# Patient Record
Sex: Male | Born: 1960 | Race: Black or African American | Hispanic: No | Marital: Single | Smoking: Former smoker
Health system: Southern US, Community
[De-identification: ages and names within clinical notes are randomized; demographics above are authoritative.]

## PROBLEM LIST (undated history)

## (undated) DIAGNOSIS — R17 Unspecified jaundice: Secondary | ICD-10-CM

## (undated) DIAGNOSIS — E876 Hypokalemia: Secondary | ICD-10-CM

## (undated) DIAGNOSIS — E43 Unspecified severe protein-calorie malnutrition: Secondary | ICD-10-CM

## (undated) DIAGNOSIS — R7989 Other specified abnormal findings of blood chemistry: Secondary | ICD-10-CM

## (undated) DIAGNOSIS — D649 Anemia, unspecified: Secondary | ICD-10-CM

## (undated) DIAGNOSIS — F1410 Cocaine abuse, uncomplicated: Secondary | ICD-10-CM

## (undated) DIAGNOSIS — K831 Obstruction of bile duct: Principal | ICD-10-CM

## (undated) DIAGNOSIS — R945 Abnormal results of liver function studies: Secondary | ICD-10-CM

## (undated) HISTORY — DX: Unspecified severe protein-calorie malnutrition: E43

## (undated) HISTORY — PX: EYE SURGERY: SHX253

## (undated) HISTORY — DX: Abnormal results of liver function studies: R94.5

## (undated) HISTORY — DX: Hypokalemia: E87.6

## (undated) HISTORY — DX: Unspecified jaundice: R17

## (undated) HISTORY — DX: Obstruction of bile duct: K83.1

## (undated) HISTORY — DX: Other specified abnormal findings of blood chemistry: R79.89

## (undated) HISTORY — DX: Cocaine abuse, uncomplicated: F14.10

## (undated) HISTORY — DX: Anemia, unspecified: D64.9

---

## 2014-09-09 LAB — HEPATIC FUNCTION PANEL
ALK PHOS: 350 U/L — AB (ref 25–125)
ALT: 53 U/L — AB (ref 10–40)
AST: 42 U/L — AB (ref 14–40)
BILIRUBIN, TOTAL: 4.9 mg/dL

## 2014-09-09 LAB — BASIC METABOLIC PANEL
BUN: 53 mg/dL — AB (ref 4–21)
Creatinine: 1.6 mg/dL — AB (ref 0.6–1.3)
Glucose: 88 mg/dL
Potassium: 5.4 mmol/L — AB (ref 3.4–5.3)
SODIUM: 126 mmol/L — AB (ref 137–147)

## 2016-08-15 ENCOUNTER — Encounter (HOSPITAL_COMMUNITY): Payer: Self-pay | Admitting: Emergency Medicine

## 2016-08-15 ENCOUNTER — Emergency Department (HOSPITAL_COMMUNITY): Payer: Medicare Other

## 2016-08-15 ENCOUNTER — Inpatient Hospital Stay (HOSPITAL_COMMUNITY)
Admission: EM | Admit: 2016-08-15 | Discharge: 2016-08-22 | DRG: 445 | Disposition: A | Discharge status: Skilled Nursing Facility | Payer: Medicare Other | Attending: Internal Medicine | Admitting: Internal Medicine

## 2016-08-15 DIAGNOSIS — E871 Hypo-osmolality and hyponatremia: Secondary | ICD-10-CM | POA: Diagnosis present

## 2016-08-15 DIAGNOSIS — E860 Dehydration: Secondary | ICD-10-CM | POA: Diagnosis present

## 2016-08-15 DIAGNOSIS — R7989 Other specified abnormal findings of blood chemistry: Secondary | ICD-10-CM | POA: Diagnosis present

## 2016-08-15 DIAGNOSIS — K831 Obstruction of bile duct: Secondary | ICD-10-CM | POA: Diagnosis present

## 2016-08-15 DIAGNOSIS — E876 Hypokalemia: Secondary | ICD-10-CM | POA: Diagnosis present

## 2016-08-15 DIAGNOSIS — R945 Abnormal results of liver function studies: Secondary | ICD-10-CM

## 2016-08-15 DIAGNOSIS — N179 Acute kidney failure, unspecified: Secondary | ICD-10-CM | POA: Diagnosis present

## 2016-08-15 DIAGNOSIS — R634 Abnormal weight loss: Secondary | ICD-10-CM | POA: Diagnosis present

## 2016-08-15 DIAGNOSIS — R17 Unspecified jaundice: Secondary | ICD-10-CM | POA: Diagnosis not present

## 2016-08-15 DIAGNOSIS — D649 Anemia, unspecified: Secondary | ICD-10-CM | POA: Diagnosis present

## 2016-08-15 DIAGNOSIS — Z87891 Personal history of nicotine dependence: Secondary | ICD-10-CM

## 2016-08-15 LAB — CBC
HCT: 25.9 % — ABNORMAL LOW (ref 39.0–52.0)
HEMOGLOBIN: 8.8 g/dL — AB (ref 13.0–17.0)
MCH: 33.1 pg (ref 26.0–34.0)
MCHC: 34 g/dL (ref 30.0–36.0)
MCV: 97.4 fL (ref 78.0–100.0)
Platelets: 373 10*3/uL (ref 150–400)
RBC: 2.66 MIL/uL — ABNORMAL LOW (ref 4.22–5.81)
RDW: 16.8 % — ABNORMAL HIGH (ref 11.5–15.5)
WBC: 8.3 10*3/uL (ref 4.0–10.5)

## 2016-08-15 LAB — URINALYSIS, ROUTINE W REFLEX MICROSCOPIC
Glucose, UA: NEGATIVE mg/dL
HGB URINE DIPSTICK: NEGATIVE
Ketones, ur: NEGATIVE mg/dL
LEUKOCYTES UA: NEGATIVE
NITRITE: NEGATIVE
PROTEIN: NEGATIVE mg/dL
SPECIFIC GRAVITY, URINE: 1.012 (ref 1.005–1.030)
pH: 5 (ref 5.0–8.0)

## 2016-08-15 LAB — COMPREHENSIVE METABOLIC PANEL
ALK PHOS: 1140 U/L — AB (ref 38–126)
ALT: 330 U/L — ABNORMAL HIGH (ref 17–63)
AST: 407 U/L — ABNORMAL HIGH (ref 15–41)
Albumin: 2.2 g/dL — ABNORMAL LOW (ref 3.5–5.0)
Anion gap: 10 (ref 5–15)
BUN: 24 mg/dL — ABNORMAL HIGH (ref 6–20)
CALCIUM: 8.8 mg/dL — AB (ref 8.9–10.3)
CO2: 21 mmol/L — AB (ref 22–32)
Chloride: 95 mmol/L — ABNORMAL LOW (ref 101–111)
Creatinine, Ser: 1.36 mg/dL — ABNORMAL HIGH (ref 0.61–1.24)
GFR calc Af Amer: >60 mL/min (ref >60)
GFR, EST NON AFRICAN AMERICAN: 57 mL/min — AB (ref >60)
Glucose, Bld: 98 mg/dL (ref 65–99)
Potassium: 3.6 mmol/L (ref 3.5–5.1)
SODIUM: 126 mmol/L — AB (ref 135–145)
Total Bilirubin: 24.9 mg/dL (ref 0.3–1.2)
Total Protein: 6.9 g/dL (ref 6.5–8.1)

## 2016-08-15 LAB — LIPASE, BLOOD: LIPASE: 117 U/L — AB (ref 11–51)

## 2016-08-15 NOTE — ED Provider Notes (Signed)
[facility]-EMERGENCY DEPT Provider Note   CSN: [id number] Arrival date & time: 08/15/16  1634     History   Chief Complaint Chief Complaint  Patient presents with  . Jaundice    HPI Travis Pugh is a 56 y.o. male.  HPI   Patient sent in by urgent care for evaluation of jaundice.  States in early January he had N/V/D x 2 weeks with weight loss and generalized weakness.  Vomiting and diarrhea have resolved, pt is gaining weight back but still feels weak.  He has also had cough and SOB, itching all over for the past few weeks.  He went back to work today and was told his eyes were yellow.  Denies sick contacts, abnormal or concerning foods, recent travel.  Has never lived outside the [place].  No hx liver problems. Does not use tylenol.  Occasional alcohol use.  Denies IVDU.     History reviewed. No pertinent past medical history.  Patient Active Problem List   Diagnosis Date Noted  . Jaundice 08/16/2016    Past Surgical History:  Procedure Laterality Date  . EYE SURGERY     Right       Home Medications    Prior to Admission medications   Medication Sig Start Date End Date Taking? Authorizing Provider  aspirin-sod bicarb-citric acid (ALKA-SELTZER) 325 MG TBEF tablet Take 325 mg by mouth every 6 (six) hours as needed.   Yes Historical Provider, MD  Multiple Vitamins-Minerals (EMERGEN-C VITAMIN C PO) Take 1 packet by mouth daily.   Yes Historical Provider, MD  Phenylephrine-DM (THERAFLU COLD/COUGH DAYTIME PO) Take 1 capsule by mouth daily.   Yes Historical Provider, MD    Family History No family history on file.  Social History Social History  Substance Use Topics  . Smoking status: Former Games developermoker  . Smokeless tobacco: Never Used  . Alcohol use No     Allergies   Patient has no allergy information on record.   Review of Systems Review of Systems   Physical Exam Updated Vital Signs BP 126/83 (BP Location: Left Arm)   Pulse 71   Temp 99.3 F (37.4 C) (Oral)    Resp 16   Ht 5\' 9"  (1.753 m)   Wt 70.3 kg   SpO2 100%   BMI 22.89 kg/m   Physical Exam  Constitutional: He appears well-developed and well-nourished. No distress.  HENT:  Head: Normocephalic and atraumatic.  Eyes: Scleral icterus is present.  Neck: Normal range of motion. Neck supple.  Cardiovascular: Normal rate and regular rhythm.   Pulmonary/Chest: Effort normal and breath sounds normal. No respiratory distress. He has no wheezes. He has no rales.  Abdominal: Soft. He exhibits no distension and no mass. There is no tenderness. There is no rebound and no guarding.  Neurological: He is alert. He exhibits normal muscle tone.  Skin: He is not diaphoretic.  Nursing note and vitals reviewed.    ED Treatments / Results  Labs (all labs ordered are listed, but only abnormal results are displayed) Labs Reviewed  LIPASE, BLOOD - Abnormal; Notable for the following:       Result Value   Lipase 117 (*)    All other components within normal limits  COMPREHENSIVE METABOLIC PANEL - Abnormal; Notable for the following:    Sodium 126 (*)    Chloride 95 (*)    CO2 21 (*)    BUN 24 (*)    Creatinine, Ser 1.36 (*)    Calcium 8.8 (*)  Albumin 2.2 (*)    AST 407 (*)    ALT 330 (*)    Alkaline Phosphatase 1,140 (*)    Total Bilirubin 24.9 (*)    GFR calc non Af Amer 57 (*)    All other components within normal limits  CBC - Abnormal; Notable for the following:    RBC 2.66 (*)    Hemoglobin 8.8 (*)    HCT 25.9 (*)    RDW 16.8 (*)    All other components within normal limits  URINALYSIS, ROUTINE W REFLEX MICROSCOPIC - Abnormal; Notable for the following:    Color, Urine AMBER (*)    Bilirubin Urine MODERATE (*)    All other components within normal limits  PROTIME-INR - Abnormal; Notable for the following:    Prothrombin Time 15.6 (*)    All other components within normal limits  APTT  HEPATITIS PANEL, ACUTE    EKG  EKG Interpretation None       Radiology Dg Chest 2  View  Result Date: 08/15/2016 CLINICAL DATA:  Cough and shortness of breath. Diarrhea. Dry and itchy skin. Symptoms began last month. Patient seen at urgent care and sent here for jaundice. Previous smoker. EXAM: CHEST  2 VIEW COMPARISON:  None. FINDINGS: The heart size and mediastinal contours are within normal limits. Both lungs are clear. The visualized skeletal structures are unremarkable. IMPRESSION: No active cardiopulmonary disease. Electronically Signed   By: Burman Nieves M.D.   On: 08/15/2016 23:03   US Abdomen Complete  Result Date: 08/16/2016 CLINICAL DATA:  Abnormal liver function studies.  Jaundice. EXAM: ABDOMEN ULTRASOUND COMPLETE COMPARISON:  None. FINDINGS: Gallbladder: Gallbladder is mildly distended and is diffusely sludge filled. No focal stones identified. No gallbladder wall thickening or edema. Murphy's sign is positive. Common bile duct: Diameter: 16 mm. Intra and extrahepatic bile ducts are dilated. No stone or obstructing lesion is identified but the distal common bile duct is not visualized due to bowel gas. Liver: Diffusely increased hepatic parenchymal echotexture suggesting fatty infiltration. No focal lesions identified. IVC: No abnormality visualized. Pancreas: Visualized portion unremarkable. Spleen: Size and appearance within normal limits. Right Kidney: Length: 12.7 cm. Echogenicity within normal limits. No mass or hydronephrosis visualized. Left Kidney: Length: 11.5 cm. Echogenicity within normal limits. No mass or hydronephrosis visualized. Abdominal aorta: No aneurysm visualized. Other findings: None. IMPRESSION: Gallbladder is distended and diffusely sludge filled with positive Murphy's sign. No stones are identified but changes could indicate cholecystitis. Intra and extrahepatic bile duct dilatation is present. No cause is identified but the distal common bile duct is not visualized. Electronically Signed   By: Burman Nieves M.D.   On: 08/16/2016 00:40   Ct  Abdomen Pelvis W Contrast  Result Date: 08/16/2016 CLINICAL DATA:  56 y/o M; elevated lipase and liver enzymes. Jaundice. EXAM: CT ABDOMEN AND PELVIS WITH CONTRAST TECHNIQUE: Multidetector CT imaging of the abdomen and pelvis was performed using the standard protocol following bolus administration of intravenous contrast. CONTRAST:  100 cc Isovue-300. COMPARISON:  08/15/2016 abdominal ultrasound. FINDINGS: Lower chest: Mild centrilobular and paraseptal emphysematous changes. Hepatobiliary: Severe intra and extrahepatic biliary ductal dilatation as well as marked enlargement of the gallbladder. Ductal dilatation sharply tapers within the pancreatic head and there is question of a mass near the sphincter of Oddi measuring 15 mm (series 2, image 35). Otherwise there is no focal abnormality of the liver identified. Pancreas: No main duct dilatation of the pancreas or appreciable inflammatory change. Spleen: Normal in size without focal  abnormality. Adrenals/Urinary Tract: Adrenal glands are unremarkable. Kidneys are normal, without renal calculi, focal lesion, or hydronephrosis. Bladder is unremarkable. Stomach/Bowel: Stomach is within normal limits. Appendix appears normal. No evidence of bowel wall thickening, distention, or inflammatory changes. Vascular/Lymphatic: Aortic atherosclerosis with mild calcification, no aneurysm. No enlarged abdominal or pelvic lymph nodes. Reproductive: Central prostatic calcifications. Other: No abdominal wall hernia or abnormality. No abdominopelvic ascites. Musculoskeletal: No acute or significant osseous findings. Transitional L5 vertebral body with articulation of transverse processes to the sacral ala and mild lumbar levocurvature. IMPRESSION: Severe intra and extrahepatic biliary ductal dilatation and gallbladder enlargement with sharp tapering to occlusion of the common bile duct within the pancreatic head. No obstructing radiopaque gallstone identified. Possible mass near the  sphincter of Oddi. No pancreatic ductal dilatation. MRI/MRCP of the abdomen with and without contrast is recommended. Electronically Signed   By: Mitzi Hansen M.D.   On: 08/16/2016 04:48    Procedures Procedures (including critical care time)  Medications Ordered in ED Medications  iopamidol (ISOVUE-300) 61 % injection (not administered)  sodium chloride 0.9 % injection (not administered)  sodium chloride 0.9 % bolus 1,000 mL (0 mLs Intravenous Stopped 08/16/16 0701)  iopamidol (ISOVUE-300) 61 % injection 100 mL (100 mLs Intravenous Contrast Given 08/16/16 0418)     Initial Impression / Assessment and Plan / ED Course  I have reviewed the triage vital signs and the nursing notes.  Pertinent labs & imaging results that were available during my care of the patient were reviewed by me and considered in my medical decision making (see chart for details).  Clinical Course as of Aug 16 726  Wed Aug 16, 2016  4782 Discussed pt with Dr Madilyn Fireman, [facility], who recommends CT abd/pelvis suspecting fixed obstruction somewhere.  Will admit to hospital for further IVF.  CT abd/pelvis pending.    [EW]  0615 Discussed pt with Dr Julian Reil, [name], who accepts patient for admission.   [EW]    Clinical Course User Index [EW] Trixie Dredge, PA-C    Afebrile nontoxic patient with jaundice.  No abdominal pain or tenderness.  Hx severe N/V/D earlier this month that has resolved, had weight decrease and fatigue, now improving.  Labs demonstrate significant elevation LFTs.  CT demonstrates dilatation of biliary system and possible mass.  Findings discussed with patient.  Discussed pt with Dr Madilyn Fireman, GI, who will consult.  Admitted to [name], Dr Julian Reil accepting.    Final Clinical Impressions(s) / ED Diagnoses   Final diagnoses:  Jaundice  Elevated LFTs    New Prescriptions New Prescriptions   No medications on file     [place], PA-C 08/16/16 9562    Mancel Bale,  MD 08/16/16 [phone]

## 2016-08-15 NOTE — ED Triage Notes (Signed)
Patient reports diarrhea & dry/itchy skin starting last month. Patient seen at urgent care and sent here for jaundice.

## 2016-08-16 ENCOUNTER — Inpatient Hospital Stay (HOSPITAL_COMMUNITY): Payer: Medicare Other

## 2016-08-16 ENCOUNTER — Encounter (HOSPITAL_COMMUNITY): Payer: Self-pay | Admitting: Family Medicine

## 2016-08-16 ENCOUNTER — Emergency Department (HOSPITAL_COMMUNITY): Payer: Medicare Other

## 2016-08-16 DIAGNOSIS — E871 Hypo-osmolality and hyponatremia: Secondary | ICD-10-CM | POA: Diagnosis present

## 2016-08-16 DIAGNOSIS — N179 Acute kidney failure, unspecified: Secondary | ICD-10-CM | POA: Diagnosis present

## 2016-08-16 DIAGNOSIS — D649 Anemia, unspecified: Secondary | ICD-10-CM

## 2016-08-16 DIAGNOSIS — K831 Obstruction of bile duct: Secondary | ICD-10-CM

## 2016-08-16 DIAGNOSIS — R634 Abnormal weight loss: Secondary | ICD-10-CM | POA: Diagnosis present

## 2016-08-16 DIAGNOSIS — R17 Unspecified jaundice: Secondary | ICD-10-CM | POA: Diagnosis present

## 2016-08-16 DIAGNOSIS — E860 Dehydration: Secondary | ICD-10-CM | POA: Diagnosis present

## 2016-08-16 DIAGNOSIS — R7989 Other specified abnormal findings of blood chemistry: Secondary | ICD-10-CM | POA: Diagnosis present

## 2016-08-16 DIAGNOSIS — E876 Hypokalemia: Secondary | ICD-10-CM | POA: Diagnosis present

## 2016-08-16 DIAGNOSIS — Z87891 Personal history of nicotine dependence: Secondary | ICD-10-CM | POA: Diagnosis not present

## 2016-08-16 HISTORY — DX: Unspecified jaundice: R17

## 2016-08-16 HISTORY — DX: Obstruction of bile duct: K83.1

## 2016-08-16 HISTORY — DX: Anemia, unspecified: D64.9

## 2016-08-16 LAB — VITAMIN B12: Vitamin B-12: 1018 pg/mL — ABNORMAL HIGH (ref 180–914)

## 2016-08-16 LAB — RETICULOCYTES
RBC.: 2.9 MIL/uL — ABNORMAL LOW (ref 4.22–5.81)
RETIC COUNT ABSOLUTE: 116 10*3/uL (ref 19.0–186.0)
RETIC CT PCT: 4 % — AB (ref 0.4–3.1)

## 2016-08-16 LAB — SODIUM, URINE, RANDOM: SODIUM UR: 39 mmol/L

## 2016-08-16 LAB — FERRITIN: FERRITIN: 1656 ng/mL — AB (ref 24–336)

## 2016-08-16 LAB — RAPID URINE DRUG SCREEN, HOSP PERFORMED
AMPHETAMINES: NOT DETECTED
Barbiturates: NOT DETECTED
Benzodiazepines: NOT DETECTED
Cocaine: POSITIVE — AB
Opiates: NOT DETECTED
Tetrahydrocannabinol: NOT DETECTED

## 2016-08-16 LAB — ACETAMINOPHEN LEVEL

## 2016-08-16 LAB — IRON AND TIBC
IRON: 48 ug/dL (ref 45–182)
Saturation Ratios: ELEVATED % (ref 17.9–39.5)
TIBC: ELEVATED ug/dL (ref 250–450)
UIBC: ELEVATED ug/dL

## 2016-08-16 LAB — CREATININE, URINE, RANDOM: Creatinine, Urine: 88.03 mg/dL

## 2016-08-16 LAB — FOLATE: Folate: 19.1 ng/mL (ref >5.9)

## 2016-08-16 LAB — PROTIME-INR
INR: 1.23
PROTHROMBIN TIME: 15.6 s — AB (ref 11.4–15.2)

## 2016-08-16 LAB — APTT: aPTT: 35 seconds (ref 24–36)

## 2016-08-16 MED ORDER — SODIUM CHLORIDE 0.9 % IV SOLN
INTRAVENOUS | Status: DC
  Administered 2016-08-16 (×2): via INTRAVENOUS
  Administered 2016-08-17: 100 mL/h via INTRAVENOUS
  Administered 2016-08-18: 21:00:00 via INTRAVENOUS
  Administered 2016-08-18: 100 mL/h via INTRAVENOUS
  Administered 2016-08-18 – 2016-08-19 (×2): via INTRAVENOUS
  Administered 2016-08-19: 100 mL/h via INTRAVENOUS
  Administered 2016-08-20 – 2016-08-21 (×6): via INTRAVENOUS

## 2016-08-16 MED ORDER — IOPAMIDOL (ISOVUE-300) INJECTION 61%
INTRAVENOUS | Status: AC
  Filled 2016-08-16: qty 100

## 2016-08-16 MED ORDER — ONDANSETRON HCL 4 MG PO TABS: 4.0000 mg | ORAL_TABLET | Freq: Four times a day (QID) | ORAL | Status: DC | PRN

## 2016-08-16 MED ORDER — SODIUM CHLORIDE 0.9 % IV BOLUS (SEPSIS)
1000.0000 mL | Freq: Once | INTRAVENOUS | Status: AC
  Administered 2016-08-16: 1000 mL via INTRAVENOUS

## 2016-08-16 MED ORDER — SENNOSIDES-DOCUSATE SODIUM 8.6-50 MG PO TABS: 1.0000 | ORAL_TABLET | Freq: Every evening | ORAL | Status: DC | PRN

## 2016-08-16 MED ORDER — ACETAMINOPHEN 650 MG RE SUPP: 650.0000 mg | Freq: Four times a day (QID) | RECTAL | Status: DC | PRN

## 2016-08-16 MED ORDER — HEPARIN SODIUM (PORCINE) 5000 UNIT/ML IJ SOLN
5000.0000 [IU] | Freq: Three times a day (TID) | INTRAMUSCULAR | Status: DC
  Administered 2016-08-16 – 2016-08-17 (×3): 5000 [IU] via SUBCUTANEOUS
  Filled 2016-08-16 (×4): qty 1

## 2016-08-16 MED ORDER — ACETAMINOPHEN 325 MG PO TABS
650.0000 mg | ORAL_TABLET | Freq: Four times a day (QID) | ORAL | Status: DC | PRN
  Administered 2016-08-18 – 2016-08-22 (×3): 650 mg via ORAL
  Filled 2016-08-16 (×3): qty 2

## 2016-08-16 MED ORDER — IOPAMIDOL (ISOVUE-300) INJECTION 61%
100.0000 mL | Freq: Once | INTRAVENOUS | Status: AC | PRN
  Administered 2016-08-16: 100 mL via INTRAVENOUS

## 2016-08-16 MED ORDER — SODIUM CHLORIDE 0.9 % IJ SOLN
INTRAMUSCULAR | Status: AC
  Filled 2016-08-16: qty 50

## 2016-08-16 MED ORDER — GADOBENATE DIMEGLUMINE 529 MG/ML IV SOLN
15.0000 mL | Freq: Once | INTRAVENOUS | Status: AC | PRN
  Administered 2016-08-16: 14 mL via INTRAVENOUS

## 2016-08-16 MED ORDER — ONDANSETRON HCL 4 MG/2ML IJ SOLN
4.0000 mg | Freq: Four times a day (QID) | INTRAMUSCULAR | Status: DC | PRN
  Administered 2016-08-19: 4 mg via INTRAVENOUS
  Filled 2016-08-16: qty 2

## 2016-08-16 NOTE — Progress Notes (Signed)
Patient arrives to [facility] from ED.  CT complete, MRI pending.  Patient has no complaints of pain.  IVF= NS @ 100 ml/hr.  Patient oriented to room, vital signs stable, no distress noted. 

## 2016-08-16 NOTE — ED Notes (Signed)
Pt returned from MRI, MD at the bedside

## 2016-08-16 NOTE — H&P (Addendum)
History and Physical    Quenton Recendez WUJ:[id number] DOB: September 13, 1960  DOA: 08/15/2016 PCP: Knox Royalty, MD Patient coming from: Home   Chief Complaint: yellow eyes   HPI: Travis Pugh is a 56 y.o. male with no significant PMHx, presented to the ED c/o severe jaundice first noted last night. Patient was seen his PCP, due to feeling tired, labs were drawn and patient was told to come to ED.   Patient stated that his sister noted his eyes to be yellow last night and told him to come to the ED. Patient reports itchiness is the only associated symptoms at this time, but recalls having diarrhea, nausea and vomiting about 2 weeks ago that resolved on its own. Patient also report feeling weak for the past 2 weeks. Denies abdominal pain. Denies drug use or casual sex. No recent travel.   ED Course: Elevated LFT's ALP >1,000, AKI cr 1.36. CT Abdomen shows severe intra and extrahepatic ductal duct dilatation and gallbladder enlargement   Review of Systems:   General: No fever chills or decrease in energy. Positive weight loss (~10-20 lb in 1-2 months)  HEENT: no blurry vision, hearing changes or sore throat Respiratory: no dyspnea, coughing, wheezing CV: no chest pain, no palpitations GI: See HPI  GU: no dysuria, burning on urination, increased urinary frequency, hematuria  Ext:. No deformities,  Neuro: no unilateral weakness, numbness, or tingling, no vision change or hearing loss Skin: No rashes, lesions or wounds. MSK: No muscle spasm, no deformity, no limitation of range of movement in spin Heme: No easy bruising.  Travel history: No recent long distant travel.   History reviewed. No pertinent past medical history.  Past Surgical History:  Procedure Laterality Date  . EYE SURGERY     Right     reports that he has quit smoking. He has never used smokeless tobacco. He reports that he does not drink alcohol or use drugs.   History reviewed. No pertinent family history.   Prior  to Admission medications   Medication Sig Start Date End Date Taking? Authorizing Provider  aspirin-sod bicarb-citric acid (ALKA-SELTZER) 325 MG TBEF tablet Take 325 mg by mouth every 6 (six) hours as needed.   Yes Historical Provider, MD  Multiple Vitamins-Minerals (EMERGEN-C VITAMIN C PO) Take 1 packet by mouth daily.   Yes Historical Provider, MD  Phenylephrine-DM (THERAFLU COLD/COUGH DAYTIME PO) Take 1 capsule by mouth daily.   Yes Historical Provider, MD    Physical Exam: Vitals:   08/16/16 0145 08/16/16 0147 08/16/16 0431 08/16/16 0652  BP: 128/90 128/90 117/83 126/83  Pulse: 80 82 78 71  Resp:  16 16 16   Temp:   99.3 F (37.4 C)   TempSrc:   Oral   SpO2: 100% 100% 100% 100%  Weight:      Height:         Constitutional: NAD, calm, comfortable Eyes: PERRL, scleral icterus 3+  ENMT: Mucous membranes are moist. Posterior pharynx clear of any exudate or lesions.  Neck: normal, supple, no masses, no thyromegaly Respiratory: clear to auscultation bilaterally, no wheezing, no crackles. Normal respiratory effort. No accessory muscle use.  Cardiovascular: Regular rate and rhythm, no murmurs / rubs / gallops. No extremity edema. 2+ pedal pulses.  Abdomen: no tenderness, no masses palpated. No hepatosplenomegaly. Bowel sounds positive.  Musculoskeletal: no clubbing. Good ROM, no contractures. Normal muscle tone.  Skin: dry, jaundice. No rash or ulcers Neurologic: CN 2-12 grossly intact. Sensation intact. Strength 5/5 in all 4.  Psychiatric:  Normal judgment and insight. Alert and oriented x 3. Normal mood.    Labs on Admission: I have personally reviewed following labs and imaging studies  CBC:  Recent Labs Lab 08/15/16 2222  WBC 8.3  HGB 8.8*  HCT 25.9*  MCV 97.4  PLT 373   Basic Metabolic Panel:  Recent Labs Lab 08/15/16 2222  NA 126*  K 3.6  CL 95*  CO2 21*  GLUCOSE 98  BUN 24*  CREATININE 1.36*  CALCIUM 8.8*   GFR: Estimated Creatinine Clearance: 61 mL/min  (by C-G formula based on SCr of 1.36 mg/dL (H)). Liver Function Tests:  Recent Labs Lab 08/15/16 2222  AST 407*  ALT 330*  ALKPHOS 1,140*  BILITOT 24.9*  PROT 6.9  ALBUMIN 2.2*    Recent Labs Lab 08/15/16 2222  LIPASE 117*    Coagulation Profile:  Recent Labs Lab 08/15/16 2222  INR 1.23   Cardiac Enzymes: No results for input(s): CKTOTAL, CKMB, CKMBINDEX, TROPONINI in the last 168 hours. BNP (last 3 results) No results for input(s): PROBNP in the last 8760 hours. HbA1C: No results for input(s): HGBA1C in the last 72 hours. CBG: No results for input(s): GLUCAP in the last 168 hours. Lipid Profile: No results for input(s): CHOL, HDL, LDLCALC, TRIG, CHOLHDL, LDLDIRECT in the last 72 hours. Thyroid Function Tests: No results for input(s): TSH, T4TOTAL, FREET4, T3FREE, THYROIDAB in the last 72 hours. Anemia Panel: No results for input(s): VITAMINB12, FOLATE, FERRITIN, TIBC, IRON, RETICCTPCT in the last 72 hours. Urine analysis:    Component Value Date/Time   COLORURINE AMBER (A) 08/15/2016 2024   APPEARANCEUR CLEAR 08/15/2016 2024   LABSPEC 1.012 08/15/2016 2024   PHURINE 5.0 08/15/2016 2024   GLUCOSEU NEGATIVE 08/15/2016 2024   HGBUR NEGATIVE 08/15/2016 2024   BILIRUBINUR MODERATE (A) 08/15/2016 2024   KETONESUR NEGATIVE 08/15/2016 2024   PROTEINUR NEGATIVE 08/15/2016 2024   NITRITE NEGATIVE 08/15/2016 2024   LEUKOCYTESUR NEGATIVE 08/15/2016 2024    Radiological Exams on Admission: Dg Chest 2 View  Result Date: 08/15/2016 CLINICAL DATA:  Cough and shortness of breath. Diarrhea. Dry and itchy skin. Symptoms began last month. Patient seen at urgent care and sent here for jaundice. Previous smoker. EXAM: CHEST  2 VIEW COMPARISON:  None. FINDINGS: The heart size and mediastinal contours are within normal limits. Both lungs are clear. The visualized skeletal structures are unremarkable. IMPRESSION: No active cardiopulmonary disease. Electronically Signed   By:  Burman NievesWilliam  Stevens M.D.   On: 08/15/2016 23:03   [place] Abdomen Complete  Result Date: 08/16/2016 CLINICAL DATA:  Abnormal liver function studies.  Jaundice. EXAM: ABDOMEN ULTRASOUND COMPLETE COMPARISON:  None. FINDINGS: Gallbladder: Gallbladder is mildly distended and is diffusely sludge filled. No focal stones identified. No gallbladder wall thickening or edema. Murphy's sign is positive. Common bile duct: Diameter: 16 mm. Intra and extrahepatic bile ducts are dilated. No stone or obstructing lesion is identified but the distal common bile duct is not visualized due to bowel gas. Liver: Diffusely increased hepatic parenchymal echotexture suggesting fatty infiltration. No focal lesions identified. IVC: No abnormality visualized. Pancreas: Visualized portion unremarkable. Spleen: Size and appearance within normal limits. Right Kidney: Length: 12.7 cm. Echogenicity within normal limits. No mass or hydronephrosis visualized. Left Kidney: Length: 11.5 cm. Echogenicity within normal limits. No mass or hydronephrosis visualized. Abdominal aorta: No aneurysm visualized. Other findings: None. IMPRESSION: Gallbladder is distended and diffusely sludge filled with positive Murphy's sign. No stones are identified but changes could indicate cholecystitis. Intra and extrahepatic bile  duct dilatation is present. No cause is identified but the distal common bile duct is not visualized. Electronically Signed   By: Burman Nieves M.D.   On: 08/16/2016 00:40   Ct Abdomen Pelvis W Contrast  Result Date: 08/16/2016 CLINICAL DATA:  56 y/o M; elevated lipase and liver enzymes. Jaundice. EXAM: CT ABDOMEN AND PELVIS WITH CONTRAST TECHNIQUE: Multidetector CT imaging of the abdomen and pelvis was performed using the standard protocol following bolus administration of intravenous contrast. CONTRAST:  100 cc Isovue-300. COMPARISON:  08/15/2016 abdominal ultrasound. FINDINGS: Lower chest: Mild centrilobular and paraseptal emphysematous  changes. Hepatobiliary: Severe intra and extrahepatic biliary ductal dilatation as well as marked enlargement of the gallbladder. Ductal dilatation sharply tapers within the pancreatic head and there is question of a mass near the sphincter of Oddi measuring 15 mm (series 2, image 35). Otherwise there is no focal abnormality of the liver identified. Pancreas: No main duct dilatation of the pancreas or appreciable inflammatory change. Spleen: Normal in size without focal abnormality. Adrenals/Urinary Tract: Adrenal glands are unremarkable. Kidneys are normal, without renal calculi, focal lesion, or hydronephrosis. Bladder is unremarkable. Stomach/Bowel: Stomach is within normal limits. Appendix appears normal. No evidence of bowel wall thickening, distention, or inflammatory changes. Vascular/Lymphatic: Aortic atherosclerosis with mild calcification, no aneurysm. No enlarged abdominal or pelvic lymph nodes. Reproductive: Central prostatic calcifications. Other: No abdominal wall hernia or abnormality. No abdominopelvic ascites. Musculoskeletal: No acute or significant osseous findings. Transitional L5 vertebral body with articulation of transverse processes to the sacral ala and mild lumbar levocurvature. IMPRESSION: Severe intra and extrahepatic biliary ductal dilatation and gallbladder enlargement with sharp tapering to occlusion of the common bile duct within the pancreatic head. No obstructing radiopaque gallstone identified. Possible mass near the sphincter of Oddi. No pancreatic ductal dilatation. MRI/MRCP of the abdomen with and without contrast is recommended. Electronically Signed   By: Mitzi Hansen M.D.   On: 08/16/2016 04:48    EKG: Ordered   Assessment/Plan  Common bile duct obstruction/Painless jaundice - CT show a possible mass causing obstruction, AST 407, ALT 330, ALP 1140. T bili 24.9 Admit to medsurg GI consult  MRCP  Hepatitis panel and HIV  NPO  IVF  Trend LFT's  AKI -  due to dehydration, unknown Cr baseline  IVF  Check Cr in AM Get urine lytes  Avoid nephrotoxin  Hyponatremia - likely from dehydration - asymptomatic  IVF - NS  Check BMP in AM   Anemia - unknown etiology at this time ? Hemolysis, no signs of overt bleeding  Anemia panel  Transfuse if Hgb < 7  Monitor for now   DVT prophylaxis: Heparin sq Code Status: FULL Family Communication: None at bedside  Disposition Plan: Anticipate discharge to previous home environment.  Consults called: EDP discussed case with Dr Madilyn Fireman (GI) Admission status: Inpatient - Medsurg   Latrelle Dodrill MD [name] Pager [phone]  If 7PM-7AM, please contact night-coverage [email or web address] Password [facility]  08/16/2016, 9:24 AM

## 2016-08-16 NOTE — Consult Note (Signed)
[facility] Consult Note  Referring Provider: No ref. provider found Primary Care Physician:  Andria Frames, MD Primary Gastroenterologist:  Dr.  Laurel Dimmer Complaint: Jaundice HPI: Travis Pugh is an 56 y.o. black male  who presents noticing worsening scleral icterus and pruritus after initially noticing nausea vomiting diarrhea about 2 weeks ago which improved. However he has recovered from those symptoms but has had developing jaundice and states he has had about a 20 pound weight loss despite a fairly good appetite. He has not had any abdominal pain. He went to his primary care physician was noted to be jaundiced and referred here. He has a bilirubin of 25 and alkaline phosphatase is greater than 1000.  History reviewed. No pertinent past medical history.  Past Surgical History:  Procedure Laterality Date  . EYE SURGERY     Right     (Not in a hospital admission)  Allergies: Not on File  History reviewed. No pertinent family history.  Social History:  reports that he has quit smoking. He has never used smokeless tobacco. He reports that he does not drink alcohol or use drugs.  Review of Systems: negative except as above   Blood pressure 122/86, pulse 75, temperature 98.8 F (37.1 C), temperature source Oral, resp. rate 18, height 5' 9"  (1.753 m), weight 70.3 kg (155 lb), SpO2 100 %. Head: Normocephalic, without obvious abnormality, atraumatic Neck: no adenopathy, no carotid bruit, no JVD, supple, symmetrical, trachea midline and thyroid not enlarged, symmetric, no tenderness/mass/nodules Resp: clear to auscultation bilaterally Cardio: regular rate and rhythm, S1, S2 normal, no murmur, click, rub or gallop GI: abdomen soft no mass Extremities: extremities normal, atraumatic, no cyanosis or edema  Results for orders placed or performed during the hospital encounter of 08/15/16 (from the past 48 hour(s))  Urinalysis, Routine w reflex microscopic     Status: Abnormal    Collection Time: 08/15/16  8:24 PM  Result Value Ref Range   Color, Urine AMBER (A) YELLOW    Comment: BIOCHEMICALS MAY BE AFFECTED BY COLOR   APPearance CLEAR CLEAR   Specific Gravity, Urine 1.012 1.005 - 1.030   pH 5.0 5.0 - 8.0   Glucose, UA NEGATIVE NEGATIVE mg/dL   Hgb urine dipstick NEGATIVE NEGATIVE   Bilirubin Urine MODERATE (A) NEGATIVE   Ketones, ur NEGATIVE NEGATIVE mg/dL   Protein, ur NEGATIVE NEGATIVE mg/dL   Nitrite NEGATIVE NEGATIVE   Leukocytes, UA NEGATIVE NEGATIVE  Lipase, blood     Status: Abnormal   Collection Time: 08/15/16 10:22 PM  Result Value Ref Range   Lipase 117 (H) 11 - 51 U/L  Comprehensive metabolic panel     Status: Abnormal   Collection Time: 08/15/16 10:22 PM  Result Value Ref Range   Sodium 126 (L) 135 - 145 mmol/L   Potassium 3.6 3.5 - 5.1 mmol/L   Chloride 95 (L) 101 - 111 mmol/L   CO2 21 (L) 22 - 32 mmol/L   Glucose, Bld 98 65 - 99 mg/dL   BUN 24 (H) 6 - 20 mg/dL   Creatinine, Ser 1.36 (H) 0.61 - 1.24 mg/dL   Calcium 8.8 (L) 8.9 - 10.3 mg/dL   Total Protein 6.9 6.5 - 8.1 g/dL   Albumin 2.2 (L) 3.5 - 5.0 g/dL   AST 407 (H) 15 - 41 U/L    Comment: RESULTS CONFIRMED BY MANUAL DILUTION   ALT 330 (H) 17 - 63 U/L   Alkaline Phosphatase 1,140 (H) 38 - 126 U/L   Total Bilirubin 24.9 (  HH) 0.3 - 1.2 mg/dL    Comment: CRITICAL RESULT CALLED TO, READ BACK BY AND VERIFIED WITH: Jordan Hawks RN [id number] 08/15/16 A NAVARRO    GFR calc non Af Amer 57 (L) >60 mL/min   GFR calc Af Amer >60 >60 mL/min    Comment: (NOTE) The eGFR has been calculated using the CKD EPI equation. This calculation has not been validated in all clinical situations. eGFR's persistently <60 mL/min signify possible Chronic Kidney Disease.    Anion gap 10 5 - 15  CBC     Status: Abnormal   Collection Time: 08/15/16 10:22 PM  Result Value Ref Range   WBC 8.3 4.0 - 10.5 K/uL   RBC 2.66 (L) 4.22 - 5.81 MIL/uL   Hemoglobin 8.8 (L) 13.0 - 17.0 g/dL   HCT 25.9 (L) 39.0 - 52.0 %    MCV 97.4 78.0 - 100.0 fL   MCH 33.1 26.0 - 34.0 pg   MCHC 34.0 30.0 - 36.0 g/dL   RDW 16.8 (H) 11.5 - 15.5 %   Platelets 373 150 - 400 K/uL  Protime-INR     Status: Abnormal   Collection Time: 08/15/16 10:22 PM  Result Value Ref Range   Prothrombin Time 15.6 (H) 11.4 - 15.2 seconds   INR 1.23   APTT     Status: None   Collection Time: 08/15/16 10:22 PM  Result Value Ref Range   aPTT 35 24 - 36 seconds   Dg Chest 2 View  Result Date: 08/15/2016 CLINICAL DATA:  Cough and shortness of breath. Diarrhea. Dry and itchy skin. Symptoms began last month. Patient seen at urgent care and sent here for jaundice. Previous smoker. EXAM: CHEST  2 VIEW COMPARISON:  None. FINDINGS: The heart size and mediastinal contours are within normal limits. Both lungs are clear. The visualized skeletal structures are unremarkable. IMPRESSION: No active cardiopulmonary disease. Electronically Signed   By: Lucienne Capers M.D.   On: 08/15/2016 23:03   US Abdomen Complete  Result Date: 08/16/2016 CLINICAL DATA:  Abnormal liver function studies.  Jaundice. EXAM: ABDOMEN ULTRASOUND COMPLETE COMPARISON:  None. FINDINGS: Gallbladder: Gallbladder is mildly distended and is diffusely sludge filled. No focal stones identified. No gallbladder wall thickening or edema. Murphy's sign is positive. Common bile duct: Diameter: 16 mm. Intra and extrahepatic bile ducts are dilated. No stone or obstructing lesion is identified but the distal common bile duct is not visualized due to bowel gas. Liver: Diffusely increased hepatic parenchymal echotexture suggesting fatty infiltration. No focal lesions identified. IVC: No abnormality visualized. Pancreas: Visualized portion unremarkable. Spleen: Size and appearance within normal limits. Right Kidney: Length: 12.7 cm. Echogenicity within normal limits. No mass or hydronephrosis visualized. Left Kidney: Length: 11.5 cm. Echogenicity within normal limits. No mass or hydronephrosis visualized.  Abdominal aorta: No aneurysm visualized. Other findings: None. IMPRESSION: Gallbladder is distended and diffusely sludge filled with positive Murphy's sign. No stones are identified but changes could indicate cholecystitis. Intra and extrahepatic bile duct dilatation is present. No cause is identified but the distal common bile duct is not visualized. Electronically Signed   By: Lucienne Capers M.D.   On: 08/16/2016 00:40   Ct Abdomen Pelvis W Contrast  Result Date: 08/16/2016 CLINICAL DATA:  56 y/o M; elevated lipase and liver enzymes. Jaundice. EXAM: CT ABDOMEN AND PELVIS WITH CONTRAST TECHNIQUE: Multidetector CT imaging of the abdomen and pelvis was performed using the standard protocol following bolus administration of intravenous contrast. CONTRAST:  100 cc Isovue-300. COMPARISON:  08/15/2016  abdominal ultrasound. FINDINGS: Lower chest: Mild centrilobular and paraseptal emphysematous changes. Hepatobiliary: Severe intra and extrahepatic biliary ductal dilatation as well as marked enlargement of the gallbladder. Ductal dilatation sharply tapers within the pancreatic head and there is question of a mass near the sphincter of Oddi measuring 15 mm (series 2, image 35). Otherwise there is no focal abnormality of the liver identified. Pancreas: No main duct dilatation of the pancreas or appreciable inflammatory change. Spleen: Normal in size without focal abnormality. Adrenals/Urinary Tract: Adrenal glands are unremarkable. Kidneys are normal, without renal calculi, focal lesion, or hydronephrosis. Bladder is unremarkable. Stomach/Bowel: Stomach is within normal limits. Appendix appears normal. No evidence of bowel wall thickening, distention, or inflammatory changes. Vascular/Lymphatic: Aortic atherosclerosis with mild calcification, no aneurysm. No enlarged abdominal or pelvic lymph nodes. Reproductive: Central prostatic calcifications. Other: No abdominal wall hernia or abnormality. No abdominopelvic ascites.  Musculoskeletal: No acute or significant osseous findings. Transitional L5 vertebral body with articulation of transverse processes to the sacral ala and mild lumbar levocurvature. IMPRESSION: Severe intra and extrahepatic biliary ductal dilatation and gallbladder enlargement with sharp tapering to occlusion of the common bile duct within the pancreatic head. No obstructing radiopaque gallstone identified. Possible mass near the sphincter of Oddi. No pancreatic ductal dilatation. MRI/MRCP of the abdomen with and without contrast is recommended. Electronically Signed   By: Kristine Garbe M.D.   On: 08/16/2016 04:48    Assessment: High-grade biliary obstruction presumed was from a very distal common bile duct or periampullary source. Plan:  Await MRCP. Proceed with ERCP tomorrow. Risks rationale and alternatives were explained to the patient and he wished to proceed. Irmgard Rampersaud C 08/16/2016, 10:51 AM  Pager [phone] If no answer or after 5 PM call [phone]

## 2016-08-16 NOTE — Progress Notes (Signed)
Pt currently in MRI. Will return and finish consult

## 2016-08-16 NOTE — ED Notes (Signed)
Patient transported to MRI 

## 2016-08-16 NOTE — ED Notes (Signed)
Bed: WA33 Expected date:  Expected time:  Means of arrival:  Comments: 

## 2016-08-16 NOTE — ED Notes (Signed)
He is sleeping when I enter his room. He arouses easily and is oriented x 4. His only request is to have me place a urinal at bedside; which I do.

## 2016-08-17 ENCOUNTER — Encounter (HOSPITAL_COMMUNITY)
Admission: EM | Disposition: A | Discharge status: Skilled Nursing Facility | Payer: Self-pay | Source: Home / Self Care | Attending: Internal Medicine

## 2016-08-17 ENCOUNTER — Encounter (HOSPITAL_COMMUNITY): Payer: Self-pay | Admitting: *Deleted

## 2016-08-17 ENCOUNTER — Inpatient Hospital Stay (HOSPITAL_COMMUNITY): Payer: Medicare Other | Admitting: Certified Registered Nurse Anesthetist

## 2016-08-17 ENCOUNTER — Inpatient Hospital Stay (HOSPITAL_COMMUNITY): Payer: Medicare Other

## 2016-08-17 DIAGNOSIS — N179 Acute kidney failure, unspecified: Secondary | ICD-10-CM

## 2016-08-17 DIAGNOSIS — K831 Obstruction of bile duct: Principal | ICD-10-CM

## 2016-08-17 DIAGNOSIS — D649 Anemia, unspecified: Secondary | ICD-10-CM

## 2016-08-17 DIAGNOSIS — E871 Hypo-osmolality and hyponatremia: Secondary | ICD-10-CM

## 2016-08-17 DIAGNOSIS — R17 Unspecified jaundice: Secondary | ICD-10-CM

## 2016-08-17 DIAGNOSIS — E860 Dehydration: Secondary | ICD-10-CM

## 2016-08-17 HISTORY — PX: ERCP: SHX5425

## 2016-08-17 LAB — COMPREHENSIVE METABOLIC PANEL
ALK PHOS: 1048 U/L — AB (ref 38–126)
ALT: 258 U/L — ABNORMAL HIGH (ref 17–63)
ANION GAP: 7 (ref 5–15)
AST: 243 U/L — ABNORMAL HIGH (ref 15–41)
Albumin: 1.8 g/dL — ABNORMAL LOW (ref 3.5–5.0)
BILIRUBIN TOTAL: 19 mg/dL — AB (ref 0.3–1.2)
BUN: 15 mg/dL (ref 6–20)
CALCIUM: 8.4 mg/dL — AB (ref 8.9–10.3)
CO2: 21 mmol/L — ABNORMAL LOW (ref 22–32)
Chloride: 101 mmol/L (ref 101–111)
Creatinine, Ser: 0.95 mg/dL (ref 0.61–1.24)
GFR calc Af Amer: >60 mL/min (ref >60)
Glucose, Bld: 99 mg/dL (ref 65–99)
POTASSIUM: 3.8 mmol/L (ref 3.5–5.1)
Sodium: 129 mmol/L — ABNORMAL LOW (ref 135–145)
TOTAL PROTEIN: 5.9 g/dL — AB (ref 6.5–8.1)

## 2016-08-17 LAB — CBC WITH DIFFERENTIAL/PLATELET
BASOS ABS: 0 10*3/uL (ref 0.0–0.1)
BASOS PCT: 0 %
EOS PCT: 4 %
Eosinophils Absolute: 0.3 10*3/uL (ref 0.0–0.7)
HCT: 25.1 % — ABNORMAL LOW (ref 39.0–52.0)
Hemoglobin: 8.3 g/dL — ABNORMAL LOW (ref 13.0–17.0)
LYMPHS PCT: 12 %
Lymphs Abs: 0.7 10*3/uL (ref 0.7–4.0)
MCH: 32.3 pg (ref 26.0–34.0)
MCHC: 33.1 g/dL (ref 30.0–36.0)
MCV: 97.7 fL (ref 78.0–100.0)
MONO ABS: 0.6 10*3/uL (ref 0.1–1.0)
Monocytes Relative: 9 %
NEUTROS ABS: 4.5 10*3/uL (ref 1.7–7.7)
Neutrophils Relative %: 75 %
PLATELETS: 381 10*3/uL (ref 150–400)
RBC: 2.57 MIL/uL — AB (ref 4.22–5.81)
RDW: 17.1 % — AB (ref 11.5–15.5)
WBC: 6.1 10*3/uL (ref 4.0–10.5)

## 2016-08-17 LAB — HEPATITIS PANEL, ACUTE
HCV Ab: <0.1 s/co ratio (ref 0.0–0.9)
HEP A IGM: NEGATIVE
HEP B C IGM: NEGATIVE
HEP B S AG: NEGATIVE

## 2016-08-17 LAB — HIV ANTIBODY (ROUTINE TESTING W REFLEX): HIV Screen 4th Generation wRfx: NONREACTIVE

## 2016-08-17 SURGERY — ERCP, WITH INTERVENTION IF INDICATED
Anesthesia: General

## 2016-08-17 MED ORDER — LIDOCAINE 2% (20 MG/ML) 5 ML SYRINGE
INTRAMUSCULAR | Status: DC | PRN
  Administered 2016-08-17: 100 mg via INTRAVENOUS

## 2016-08-17 MED ORDER — ONDANSETRON HCL 4 MG/2ML IJ SOLN
INTRAMUSCULAR | Status: DC | PRN
  Administered 2016-08-17: 4 mg via INTRAVENOUS

## 2016-08-17 MED ORDER — EPHEDRINE SULFATE-NACL 50-0.9 MG/10ML-% IV SOSY
PREFILLED_SYRINGE | INTRAVENOUS | Status: DC | PRN
  Administered 2016-08-17: 5 mg via INTRAVENOUS

## 2016-08-17 MED ORDER — PROMETHAZINE HCL 25 MG/ML IJ SOLN
6.2500 mg | INTRAMUSCULAR | Status: DC | PRN
Start: 2016-08-17 — End: 2016-08-17

## 2016-08-17 MED ORDER — FENTANYL CITRATE (PF) 100 MCG/2ML IJ SOLN: 25.0000 ug | INTRAMUSCULAR | Status: DC | PRN

## 2016-08-17 MED ORDER — DEXAMETHASONE SODIUM PHOSPHATE 4 MG/ML IJ SOLN
INTRAMUSCULAR | Status: DC | PRN
  Administered 2016-08-17: 10 mg via INTRAVENOUS

## 2016-08-17 MED ORDER — PHENYLEPHRINE 40 MCG/ML (10ML) SYRINGE FOR IV PUSH (FOR BLOOD PRESSURE SUPPORT)
PREFILLED_SYRINGE | INTRAVENOUS | Status: DC | PRN
  Administered 2016-08-17: 40 ug via INTRAVENOUS
  Administered 2016-08-17 (×6): 80 ug via INTRAVENOUS

## 2016-08-17 MED ORDER — PHENYLEPHRINE 40 MCG/ML (10ML) SYRINGE FOR IV PUSH (FOR BLOOD PRESSURE SUPPORT)
PREFILLED_SYRINGE | INTRAVENOUS | Status: AC
  Filled 2016-08-17: qty 10

## 2016-08-17 MED ORDER — ONDANSETRON HCL 4 MG/2ML IJ SOLN
INTRAMUSCULAR | Status: AC
  Filled 2016-08-17: qty 2

## 2016-08-17 MED ORDER — LACTATED RINGERS IV SOLN
INTRAVENOUS | Status: DC
Start: 2016-08-17 — End: 2016-08-22

## 2016-08-17 MED ORDER — SODIUM CHLORIDE 0.9 % IV SOLN: INTRAVENOUS | Status: DC

## 2016-08-17 MED ORDER — ROCURONIUM BROMIDE 50 MG/5ML IV SOSY
PREFILLED_SYRINGE | INTRAVENOUS | Status: DC | PRN
  Administered 2016-08-17: 50 mg via INTRAVENOUS

## 2016-08-17 MED ORDER — LIDOCAINE 2% (20 MG/ML) 5 ML SYRINGE
INTRAMUSCULAR | Status: AC
  Filled 2016-08-17: qty 5

## 2016-08-17 MED ORDER — FENTANYL CITRATE (PF) 100 MCG/2ML IJ SOLN
INTRAMUSCULAR | Status: DC | PRN
  Administered 2016-08-17: 100 ug via INTRAVENOUS

## 2016-08-17 MED ORDER — GLUCAGON HCL RDNA (DIAGNOSTIC) 1 MG IJ SOLR
INTRAMUSCULAR | Status: AC
  Filled 2016-08-17: qty 1

## 2016-08-17 MED ORDER — SODIUM CHLORIDE 0.9 % IV SOLN
1.5000 g | Freq: Once | INTRAVENOUS | Status: AC
  Administered 2016-08-17: 1.5 g via INTRAVENOUS
  Filled 2016-08-17: qty 1.5

## 2016-08-17 MED ORDER — SUGAMMADEX SODIUM 200 MG/2ML IV SOLN
INTRAVENOUS | Status: DC | PRN
  Administered 2016-08-17: 150 mg via INTRAVENOUS

## 2016-08-17 MED ORDER — SUGAMMADEX SODIUM 500 MG/5ML IV SOLN
INTRAVENOUS | Status: AC
  Filled 2016-08-17: qty 5

## 2016-08-17 MED ORDER — ROCURONIUM BROMIDE 50 MG/5ML IV SOSY
PREFILLED_SYRINGE | INTRAVENOUS | Status: AC
  Filled 2016-08-17: qty 5

## 2016-08-17 MED ORDER — DEXAMETHASONE SODIUM PHOSPHATE 10 MG/ML IJ SOLN
INTRAMUSCULAR | Status: AC
  Filled 2016-08-17: qty 1

## 2016-08-17 MED ORDER — SODIUM CHLORIDE 0.9 % IV SOLN
INTRAVENOUS | Status: DC | PRN
  Administered 2016-08-17: 20 mL

## 2016-08-17 MED ORDER — PROPOFOL 10 MG/ML IV BOLUS
INTRAVENOUS | Status: AC
  Filled 2016-08-17: qty 20

## 2016-08-17 MED ORDER — HEPARIN SODIUM (PORCINE) 5000 UNIT/ML IJ SOLN
5000.0000 [IU] | Freq: Three times a day (TID) | INTRAMUSCULAR | Status: DC
  Administered 2016-08-18 – 2016-08-22 (×12): 5000 [IU] via SUBCUTANEOUS
  Filled 2016-08-17 (×11): qty 1

## 2016-08-17 MED ORDER — FENTANYL CITRATE (PF) 100 MCG/2ML IJ SOLN
INTRAMUSCULAR | Status: AC
  Filled 2016-08-17: qty 2

## 2016-08-17 MED ORDER — PIPERACILLIN-TAZOBACTAM 3.375 G IVPB
3.3750 g | INTRAVENOUS | Status: AC
  Administered 2016-08-18: 3.375 g via INTRAVENOUS
  Filled 2016-08-17 (×2): qty 50

## 2016-08-17 MED ORDER — PROPOFOL 10 MG/ML IV BOLUS
INTRAVENOUS | Status: DC | PRN
  Administered 2016-08-17: 150 mg via INTRAVENOUS

## 2016-08-17 NOTE — Anesthesia Procedure Notes (Signed)
Procedure Name: Intubation Date/Time: 08/17/2016 11:23 AM Performed by: Ludwig LeanJONES, Valdemar Mcclenahan C Pre-anesthesia Checklist: Patient identified, Emergency Drugs available, Suction available and Patient being monitored Patient Re-evaluated:Patient Re-evaluated prior to inductionOxygen Delivery Method: Circle system utilized Preoxygenation: Pre-oxygenation with 100% oxygen Intubation Type: IV induction Ventilation: Mask ventilation without difficulty Tube type: Oral Tube size: 7.5 mm Number of attempts: 1 Airway Equipment and Method: Stylet and Oral airway Placement Confirmation: ETT inserted through vocal cords under direct vision,  positive ETCO2 and breath sounds checked- equal and bilateral Secured at: 23 cm Tube secured with: Tape Dental Injury: Teeth and Oropharynx as per pre-operative assessment

## 2016-08-17 NOTE — H&P (View-Only) (Signed)
Patient arrives to [facility] from ED.  CT complete, MRI pending.  Patient has no complaints of pain.  IVF= NS @ 100 ml/hr.  Patient oriented to room, vital signs stable, no distress noted.

## 2016-08-17 NOTE — Interval H&P Note (Signed)
History and Physical Interval Note:  08/17/2016 11:12 AM  Travis Pugh  has presented today for surgery, with the diagnosis of biliary obstruction  The various methods of treatment have been discussed with the patient and family. After consideration of risks, benefits and other options for treatment, the patient has consented to  Procedure(s): ENDOSCOPIC RETROGRADE CHOLANGIOPANCREATOGRAPHY (ERCP) (N/A) as a surgical intervention .  The patient's history has been reviewed, patient examined, no change in status, stable for surgery.  I have reviewed the patient's chart and labs.  Questions were answered to the patient's satisfaction.     Ellarose Brandi C

## 2016-08-17 NOTE — Progress Notes (Signed)
[name] PROGRESS NOTE  Travis Pugh OZH:[id number] DOB: [birth year] DOA: 08/15/2016 PCP: [name], MD  Interim summary and HPI 56 y.o. male with no significant PMHx, presented to the ED c/o severe jaundice first noted last night. Patient was seen his PCP, due to feeling tired, labs were drawn and patient was told to come to ED.  Patient stated that his sister noted his eyes to be yellow last night and told him to come to the ED. Patient reports itchiness is the only associated symptoms at this time, but recalls having diarrhea, nausea and vomiting about 2 weeks ago that resolved on its own. Patient also report feeling weak for the past 2 weeks. Denies abdominal pain. Denies drug use or casual sex. No recent travel.  CT Abdomen shows severe intra and extrahepatic ductal duct dilatation and gallbladder enlargement   Assessment/Plan: Common bile duct obstruction/Painless jaundice  -s/p ERCP with failure to place stent -IR has been contacted for PCT -will discuss value of general surgery for potential cholecystectomy at some point  -will follow LFT's (improved today, in comparison with admission values) -continue supportive care  AKI - due to dehydration, unknown Cr baseline  -will continue IVF's -minimize/avoid nephrotoxic agents -repeated Cr back to baseline now (0.95)  Dehydration with Hyponatremia - likely from dehydration - asymptomatic  -continue IVF's -electrolytes improving appropriately -will follow CMET in am  Anemia - unknown etiology at this time. No signs of overt bleeding  -will follow Anemia panel  -Transfuse if Hgb < 7   Code Status: Full Family Communication: no family at bedside  Disposition Plan: remains inpatient for now; IR for PCT tomorrow. Will follow LFT's   Consultants: GI IR  Procedures: See below for x-ray reports ERCP: unable to have placement of stent Planning PCT cholecystostomy on 08/18/16  Antibiotics:  None    HPI/Subjective: Patient is afebrile, denies CP and SOB. Slightly somnolent after ERCP, but otherwise in no acute distress. Patient is still anicteric   Objective: Vitals:   08/17/16 1410 08/17/16 1501  BP: 118/82 126/78  Pulse: 80 86  Resp: 18 18  Temp:  97.6 F (36.4 C)    Intake/Output Summary (Last 24 hours) at 08/17/16 1844 Last data filed at 08/17/16 1826  Gross per 24 hour  Intake          3226.67 ml  Output             1860 ml  Net          1366.67 ml   Filed Weights   08/15/16 1703  Weight: 70.3 kg (155 lb)    Exam:   General:  Afebrile, slightly somnolent after ERCP. No nausea or vomiting. Jaundice appreciated   Cardiovascular: S1 and s2, no rubs or gallops  Respiratory: CTA bilaterally  Abdomen: soft, NT, ND, positive BS  Musculoskeletal: no edema no cyanosis or clubbing   Data Reviewed: Basic Metabolic Panel:  Recent Labs Lab 08/15/16 2222 08/17/16 0513  NA 126* 129*  K 3.6 3.8  CL 95* 101  CO2 21* 21*  GLUCOSE 98 99  BUN 24* 15  CREATININE 1.36* 0.95  CALCIUM 8.8* 8.4*   Liver Function Tests:  Recent Labs Lab 08/15/16 2222 08/17/16 0513  AST 407* 243*  ALT 330* 258*  ALKPHOS 1,140* 1,048*  BILITOT 24.9* 19.0*  PROT 6.9 5.9*  ALBUMIN 2.2* 1.8*    Recent Labs Lab 08/15/16 2222  LIPASE 117*   CBC:  Recent Labs Lab 08/15/16 2222 08/17/16 0513  WBC 8.3 6.1  NEUTROABS  --  4.5  HGB 8.8* 8.3*  HCT 25.9* 25.1*  MCV 97.4 97.7  PLT 373 381    Studies: Dg Chest 2 View  Result Date: 08/15/2016 CLINICAL DATA:  Cough and shortness of breath. Diarrhea. Dry and itchy skin. Symptoms began last month. Patient seen at urgent care and sent here for jaundice. Previous smoker. EXAM: CHEST  2 VIEW COMPARISON:  None. FINDINGS: The heart size and mediastinal contours are within normal limits. Both lungs are clear. The visualized skeletal structures are unremarkable. IMPRESSION: No active cardiopulmonary disease. Electronically Signed    By: Burman Nieves M.D.   On: 08/15/2016 23:03   Dg Abd 1 View  Result Date: 08/17/2016 CLINICAL DATA:  Unsuccessful ERCP, unable to cannulate CBD EXAM: ABDOMEN - 1 VIEW COMPARISON:  MR abdomen 08/16/2016 FLUOROSCOPY TIME:  1 minutes 25 seconds Radiation dose:  25.6 mGy FINDINGS: Submitted images demonstrate failure of passage of a wire or catheter into the CBD. No contrast was injected. Visualized structures unremarkable. IMPRESSION: Failed ERCP. Electronically Signed   By: Ulyses Southward M.D.   On: 08/17/2016 13:00   US Abdomen Complete  Result Date: 08/16/2016 CLINICAL DATA:  Abnormal liver function studies.  Jaundice. EXAM: ABDOMEN ULTRASOUND COMPLETE COMPARISON:  None. FINDINGS: Gallbladder: Gallbladder is mildly distended and is diffusely sludge filled. No focal stones identified. No gallbladder wall thickening or edema. Murphy's sign is positive. Common bile duct: Diameter: 16 mm. Intra and extrahepatic bile ducts are dilated. No stone or obstructing lesion is identified but the distal common bile duct is not visualized due to bowel gas. Liver: Diffusely increased hepatic parenchymal echotexture suggesting fatty infiltration. No focal lesions identified. IVC: No abnormality visualized. Pancreas: Visualized portion unremarkable. Spleen: Size and appearance within normal limits. Right Kidney: Length: 12.7 cm. Echogenicity within normal limits. No mass or hydronephrosis visualized. Left Kidney: Length: 11.5 cm. Echogenicity within normal limits. No mass or hydronephrosis visualized. Abdominal aorta: No aneurysm visualized. Other findings: None. IMPRESSION: Gallbladder is distended and diffusely sludge filled with positive Murphy's sign. No stones are identified but changes could indicate cholecystitis. Intra and extrahepatic bile duct dilatation is present. No cause is identified but the distal common bile duct is not visualized. Electronically Signed   By: Burman Nieves M.D.   On: 08/16/2016 00:40    Ct Abdomen Pelvis W Contrast  Result Date: 08/16/2016 CLINICAL DATA:  56 y/o M; elevated lipase and liver enzymes. Jaundice. EXAM: CT ABDOMEN AND PELVIS WITH CONTRAST TECHNIQUE: Multidetector CT imaging of the abdomen and pelvis was performed using the standard protocol following bolus administration of intravenous contrast. CONTRAST:  100 cc Isovue-300. COMPARISON:  08/15/2016 abdominal ultrasound. FINDINGS: Lower chest: Mild centrilobular and paraseptal emphysematous changes. Hepatobiliary: Severe intra and extrahepatic biliary ductal dilatation as well as marked enlargement of the gallbladder. Ductal dilatation sharply tapers within the pancreatic head and there is question of a mass near the sphincter of Oddi measuring 15 mm (series 2, image 35). Otherwise there is no focal abnormality of the liver identified. Pancreas: No main duct dilatation of the pancreas or appreciable inflammatory change. Spleen: Normal in size without focal abnormality. Adrenals/Urinary Tract: Adrenal glands are unremarkable. Kidneys are normal, without renal calculi, focal lesion, or hydronephrosis. Bladder is unremarkable. Stomach/Bowel: Stomach is within normal limits. Appendix appears normal. No evidence of bowel wall thickening, distention, or inflammatory changes. Vascular/Lymphatic: Aortic atherosclerosis with mild calcification, no aneurysm. No enlarged abdominal or pelvic lymph nodes. Reproductive: Central prostatic calcifications. Other: No  abdominal wall hernia or abnormality. No abdominopelvic ascites. Musculoskeletal: No acute or significant osseous findings. Transitional L5 vertebral body with articulation of transverse processes to the sacral ala and mild lumbar levocurvature. IMPRESSION: Severe intra and extrahepatic biliary ductal dilatation and gallbladder enlargement with sharp tapering to occlusion of the common bile duct within the pancreatic head. No obstructing radiopaque gallstone identified. Possible mass  near the sphincter of Oddi. No pancreatic ductal dilatation. MRI/MRCP of the abdomen with and without contrast is recommended. Electronically Signed   By: Mitzi Hansen M.D.   On: 08/16/2016 04:48   Mr 3d Recon At Scanner  Result Date: 08/16/2016 CLINICAL DATA:  Elevated lipase and liver enzymes. Elevated bilirubin. Jaundice. Severe biliary obstruction on comparison CT. EXAM: MRI ABDOMEN WITHOUT AND WITH CONTRAST (INCLUDING MRCP) TECHNIQUE: Multiplanar multisequence MR imaging of the abdomen was performed both before and after the administration of intravenous contrast. Heavily T2-weighted images of the biliary and pancreatic ducts were obtained, and three-dimensional MRCP images were rendered by post processing. CONTRAST:  14mL MULTIHANCE GADOBENATE DIMEGLUMINE 529 MG/ML IV SOLN COMPARISON:  CT 08/16/2016 FINDINGS: Lower chest:  Lung bases are clear. Hepatobiliary: Severe intrahepatic biliary duct dilatation. No focal hepatic lesion identified. No hepatic steatosis. Gallbladder is distended to 5 cm. No gallstones identified. The common hepatic duct and the combined bile duct are severe dilated with the common hepatic duct measuring 16 mm and the common bile duct measuring 13 mm. There is abrupt narrowing of the distal common bile duct at the level of the pancreatic head. This is approximately 10 mm from the ampulla. No enhancing lesion evident within the pancreatic head. No filling defect within the common bile duct. Pancreas: The pancreatic duct is normal caliber. Potential pancreas divisum variant anatomy. The no enhancing lesion to associated with the abrupt tapering of the common bile duct through the pancreatic head. Spleen: Normal spleen. Adrenals/urinary tract: Adrenal glands and kidneys are normal. Stomach/Bowel: Stomach and limited of the small bowel is unremarkable Vascular/Lymphatic: Abdominal aortic normal caliber. No retroperitoneal periportal lymphadenopathy. Musculoskeletal: No  aggressive osseous lesion IMPRESSION: 1. Severe intrahepatic and extrahepatic biliary duct dilatation to the level of the pancreatic head with abrupt narrowing of the common bile duct approximately 10 mm from the ampulla. No obstructing lesion identified in the pancreatic head. No pancreatic duct dilatation. No choledocholithiasis or cholelithiasis. Presumably occult obstructing lesion in the pancreatic head versus ampulla. Recommend ERCP for relief of obstruction and potential tissue sampling. 2. No pancreatic duct dilatation or inflammation. Potential pancreas divisum variant ductal anatomy. 3. Distended gallbladder.  No evidence cholelithiasis. Electronically Signed   By: Genevive Bi M.D.   On: 08/16/2016 12:14   Dg C-arm [id number] Min-no Report  Result Date: 08/17/2016 Fluoroscopy was utilized by the requesting physician.  No radiographic interpretation.   Mr Abdomen Mrcp Vivien Rossetti Contast  Result Date: 08/16/2016 CLINICAL DATA:  Elevated lipase and liver enzymes. Elevated bilirubin. Jaundice. Severe biliary obstruction on comparison CT. EXAM: MRI ABDOMEN WITHOUT AND WITH CONTRAST (INCLUDING MRCP) TECHNIQUE: Multiplanar multisequence MR imaging of the abdomen was performed both before and after the administration of intravenous contrast. Heavily T2-weighted images of the biliary and pancreatic ducts were obtained, and three-dimensional MRCP images were rendered by post processing. CONTRAST:  14mL MULTIHANCE GADOBENATE DIMEGLUMINE 529 MG/ML IV SOLN COMPARISON:  CT 08/16/2016 FINDINGS: Lower chest:  Lung bases are clear. Hepatobiliary: Severe intrahepatic biliary duct dilatation. No focal hepatic lesion identified. No hepatic steatosis. Gallbladder is distended to 5 cm. No gallstones identified. The  common hepatic duct and the combined bile duct are severe dilated with the common hepatic duct measuring 16 mm and the common bile duct measuring 13 mm. There is abrupt narrowing of the distal common bile duct at  the level of the pancreatic head. This is approximately 10 mm from the ampulla. No enhancing lesion evident within the pancreatic head. No filling defect within the common bile duct. Pancreas: The pancreatic duct is normal caliber. Potential pancreas divisum variant anatomy. The no enhancing lesion to associated with the abrupt tapering of the common bile duct through the pancreatic head. Spleen: Normal spleen. Adrenals/urinary tract: Adrenal glands and kidneys are normal. Stomach/Bowel: Stomach and limited of the small bowel is unremarkable Vascular/Lymphatic: Abdominal aortic normal caliber. No retroperitoneal periportal lymphadenopathy. Musculoskeletal: No aggressive osseous lesion IMPRESSION: 1. Severe intrahepatic and extrahepatic biliary duct dilatation to the level of the pancreatic head with abrupt narrowing of the common bile duct approximately 10 mm from the ampulla. No obstructing lesion identified in the pancreatic head. No pancreatic duct dilatation. No choledocholithiasis or cholelithiasis. Presumably occult obstructing lesion in the pancreatic head versus ampulla. Recommend ERCP for relief of obstruction and potential tissue sampling. 2. No pancreatic duct dilatation or inflammation. Potential pancreas divisum variant ductal anatomy. 3. Distended gallbladder.  No evidence cholelithiasis. Electronically Signed   By: Genevive BiStewart  Edmunds M.D.   On: 08/16/2016 12:14    Scheduled Meds: . [START ON 08/18/2016] heparin  5,000 Units Subcutaneous Q8H  . piperacillin-tazobactam (ZOSYN)  IV  3.375 g Intravenous to XRAY   Continuous Infusions: . sodium chloride 100 mL/hr (08/17/16 0749)  . lactated ringers      Active Problems:   Jaundice   Common bile duct (CBD) obstruction   AKI (acute kidney injury) (HCC)   Anemia    Time spent: 25 minutes    Vassie LollMadera, Adajah Cocking  [name] Pager [phone]. If 7PM-7AM, please contact night-coverage at [email or web address], password [facility] 08/17/2016, 6:44 PM  LOS:  1 day

## 2016-08-17 NOTE — Consult Note (Signed)
Chief Complaint: obstructive jaundice  Referring Physician:Dr. Teena Irani  Supervising Physician: Arne Cleveland  Patient Status: [facility] - In-pt  HPI: Travis Pugh is an 56 y.o. male who states that for the last month he has had intermittent diarrhea and nausea.  He admits to weight loss over the last month as well.  He has noticed an increase in itching and just recently noticed his eyes were "turning yellow."  He saw his PCP who sent him for evaluation and admission given his appearance.  Upon arrival he was noted to have an elevated TB of 24.9 and alkphos of 1140.  He had a CT scan which revealed severe intra and extrahepatic biliary ductal dilatation along with an occlusion of the CBD at the pancreatic head.  GI was consulted after MRCP was performed for ERCP and possible stent placement.  Unfortunately, a stent was unable to be placed and IR has been consulted for PTC drain placement.  Past Medical History: History reviewed. No pertinent past medical history.  Past Surgical History:  Past Surgical History:  Procedure Laterality Date  . EYE SURGERY     Right    Family History: History reviewed. No pertinent family history.  Social History:  reports that he has quit smoking. He has never used smokeless tobacco. He reports that he does not drink alcohol or use drugs.  Allergies: No Known Allergies  Medications: Medications reviewed in epic.  Please HPI for pertinent positives, otherwise complete 10 system ROS negative.  Mallampati Score: MD Evaluation Airway: WNL Heart: WNL Abdomen: WNL Chest/ Lungs: WNL ASA  Classification: 2 Mallampati/Airway Score: Two  Physical Exam: BP 126/78 (BP Location: Left Arm)   Pulse 86   Temp 97.6 F (36.4 C)   Resp 18   Ht 5' 9"  (1.753 m)   Wt 155 lb (70.3 kg)   SpO2 100%   BMI 22.89 kg/m  Body mass index is 22.89 kg/m. General: pleasant, WD, WN black male who is laying in bed in NAD HEENT: head is normocephalic, atraumatic.   Sclera are icteric.  PERRL.  Ears and nose without any masses or lesions.  Mouth is pink and moist Heart: regular, rate, and rhythm.  Normal s1,s2. No obvious murmurs, gallops, or rubs noted.  Palpable radial and pedal pulses bilaterally Lungs: CTAB, no wheezes, rhonchi, or rales noted.  Respiratory effort nonlabored Abd: soft, NT, ND, +BS, no masses, hernias, or organomegaly.  Midline scar from prior laparotomy.  Psych: A&Ox3 with an appropriate affect.   Labs: Results for orders placed or performed during the hospital encounter of 08/15/16 (from the past 48 hour(s))  Urinalysis, Routine w reflex microscopic     Status: Abnormal   Collection Time: 08/15/16  8:24 PM  Result Value Ref Range   Color, Urine AMBER (A) YELLOW    Comment: BIOCHEMICALS MAY BE AFFECTED BY COLOR   APPearance CLEAR CLEAR   Specific Gravity, Urine 1.012 1.005 - 1.030   pH 5.0 5.0 - 8.0   Glucose, UA NEGATIVE NEGATIVE mg/dL   Hgb urine dipstick NEGATIVE NEGATIVE   Bilirubin Urine MODERATE (A) NEGATIVE   Ketones, ur NEGATIVE NEGATIVE mg/dL   Protein, ur NEGATIVE NEGATIVE mg/dL   Nitrite NEGATIVE NEGATIVE   Leukocytes, UA NEGATIVE NEGATIVE  Sodium, urine, random     Status: None   Collection Time: 08/15/16  8:24 PM  Result Value Ref Range   Sodium, Ur 39 mmol/L    Comment: Performed at [facility], [address], [place], [place] [address]  Creatinine, urine, random     Status: None   Collection Time: 08/15/16  8:24 PM  Result Value Ref Range   Creatinine, Urine 88.03 mg/dL    Comment: Performed at [facility], [place] [address] [place],  [address]  Urine rapid drug screen (hosp performed)     Status: Abnormal   Collection Time: 08/15/16  8:24 PM  Result Value Ref Range   Opiates NONE DETECTED NONE DETECTED   Cocaine POSITIVE (A) NONE DETECTED   Benzodiazepines NONE DETECTED NONE DETECTED   Amphetamines NONE DETECTED NONE DETECTED   Tetrahydrocannabinol NONE DETECTED NONE DETECTED    Barbiturates NONE DETECTED NONE DETECTED    Comment:        DRUG SCREEN FOR MEDICAL PURPOSES ONLY.  IF CONFIRMATION IS NEEDED FOR ANY PURPOSE, NOTIFY LAB WITHIN 5 DAYS.        LOWEST DETECTABLE LIMITS FOR URINE DRUG SCREEN Drug Class       Cutoff (ng/mL) Amphetamine      1000 Barbiturate      200 Benzodiazepine   324 Tricyclics       401 Opiates          300 Cocaine          300 THC              50   Lipase, blood     Status: Abnormal   Collection Time: 08/15/16 10:22 PM  Result Value Ref Range   Lipase 117 (H) 11 - 51 U/L  Comprehensive metabolic panel     Status: Abnormal   Collection Time: 08/15/16 10:22 PM  Result Value Ref Range   Sodium 126 (L) 135 - 145 mmol/L   Potassium 3.6 3.5 - 5.1 mmol/L   Chloride 95 (L) 101 - 111 mmol/L   CO2 21 (L) 22 - 32 mmol/L   Glucose, Bld 98 65 - 99 mg/dL   BUN 24 (H) 6 - 20 mg/dL   Creatinine, Ser 1.36 (H) 0.61 - 1.24 mg/dL   Calcium 8.8 (L) 8.9 - 10.3 mg/dL   Total Protein 6.9 6.5 - 8.1 g/dL   Albumin 2.2 (L) 3.5 - 5.0 g/dL   AST 407 (H) 15 - 41 U/L    Comment: RESULTS CONFIRMED BY MANUAL DILUTION   ALT 330 (H) 17 - 63 U/L   Alkaline Phosphatase 1,140 (H) 38 - 126 U/L   Total Bilirubin 24.9 (HH) 0.3 - 1.2 mg/dL    Comment: CRITICAL RESULT CALLED TO, READ BACK BY AND VERIFIED WITH: Jordan Hawks RN [id number] 08/15/16 A NAVARRO    GFR calc non Af Amer 57 (L) >60 mL/min   GFR calc Af Amer >60 >60 mL/min    Comment: (NOTE) The eGFR has been calculated using the CKD EPI equation. This calculation has not been validated in all clinical situations. eGFR's persistently <60 mL/min signify possible Chronic Kidney Disease.    Anion gap 10 5 - 15  CBC     Status: Abnormal   Collection Time: 08/15/16 10:22 PM  Result Value Ref Range   WBC 8.3 4.0 - 10.5 K/uL   RBC 2.66 (L) 4.22 - 5.81 MIL/uL   Hemoglobin 8.8 (L) 13.0 - 17.0 g/dL   HCT 25.9 (L) 39.0 - 52.0 %   MCV 97.4 78.0 - 100.0 fL   MCH 33.1 26.0 - 34.0 pg   MCHC 34.0 30.0 - 36.0 g/dL    RDW 16.8 (H) 11.5 - 15.5 %  Platelets 373 150 - 400 K/uL  Hepatitis panel, acute     Status: None   Collection Time: 08/15/16 10:22 PM  Result Value Ref Range   Hepatitis B Surface Ag Negative Negative   HCV Ab <0.1 0.0 - 0.9 s/co ratio    Comment: (NOTE)                                  Negative:     < 0.8                             Indeterminate: 0.8 - 0.9                                  Positive:     > 0.9 The CDC recommends that a positive HCV antibody result be followed up with a HCV Nucleic Acid Amplification test (937902). Performed At: [facility] [place], [place] [id number] Lindon Romp MD [id number]    Hep A IgM Negative Negative   Hep B C IgM Negative Negative  Protime-INR     Status: Abnormal   Collection Time: 08/15/16 10:22 PM  Result Value Ref Range   Prothrombin Time 15.6 (H) 11.4 - 15.2 seconds   INR 1.23   APTT     Status: None   Collection Time: 08/15/16 10:22 PM  Result Value Ref Range   aPTT 35 24 - 36 seconds  HIV antibody     Status: None   Collection Time: 08/16/16  8:38 AM  Result Value Ref Range   HIV Screen 4th Generation wRfx Non Reactive Non Reactive    Comment: (NOTE) Performed At: [facility] [place], [place] [id number] Lindon Romp MD JH:[id number]   Vitamin B12     Status: Abnormal   Collection Time: 08/16/16  1:13 PM  Result Value Ref Range   Vitamin B-12 1,018 (H) 180 - 914 pg/mL    Comment: (NOTE) This assay is not validated for testing neonatal or myeloproliferative syndrome specimens for Vitamin B12 levels. Performed at [facility], [place] [address], [place], [place] [address]   Folate     Status: None   Collection Time: 08/16/16  1:13 PM  Result Value Ref Range   Folate 19.1 >5.9 ng/mL    Comment: Performed at [facility] [address], [place], [place] [address]  Iron and TIBC     Status: None   Collection Time: 08/16/16  1:13 PM  Result Value  Ref Range   Iron 48 45 - 182 ug/dL   TIBC UNABLE TO CALCULATE DUE TO ELEVATED TRANSFERRIN 250 - 450 ug/dL   Saturation Ratios UNABLE TO CALCULATE DUE TO ELEVATED TRANSFERRIN 17.9 - 39.5 %   UIBC UNABLE TO CALCULATE DUE TO ELEVATED TRANSFERRIN ug/dL    Comment: Performed at [facility], [place] [address], [place], [place] [address]  Ferritin     Status: Abnormal   Collection Time: 08/16/16  1:13 PM  Result Value Ref Range   Ferritin 1,656 (H) 24 - 336 ng/mL    Comment: Performed at [facility] [address], [place], [place] [address]  Reticulocytes     Status: Abnormal   Collection Time: 08/16/16  1:13 PM  Result Value Ref  Range   Retic Ct Pct 4.0 (H) 0.4 - 3.1 %   RBC. 2.90 (L) 4.22 - 5.81 MIL/uL   Retic Count, Manual 116.0 19.0 - 186.0 K/uL  Acetaminophen level     Status: Abnormal   Collection Time: 08/16/16  1:13 PM  Result Value Ref Range   Acetaminophen (Tylenol), Serum <10 (L) 10 - 30 ug/mL    Comment:        THERAPEUTIC CONCENTRATIONS VARY SIGNIFICANTLY. A RANGE OF 10-30 ug/mL MAY BE AN EFFECTIVE CONCENTRATION FOR MANY PATIENTS. HOWEVER, SOME ARE BEST TREATED AT CONCENTRATIONS OUTSIDE THIS RANGE. ACETAMINOPHEN CONCENTRATIONS >150 ug/mL AT 4 HOURS AFTER INGESTION AND >50 ug/mL AT 12 HOURS AFTER INGESTION ARE OFTEN ASSOCIATED WITH TOXIC REACTIONS.   Comprehensive metabolic panel     Status: Abnormal   Collection Time: 08/17/16  5:13 AM  Result Value Ref Range   Sodium 129 (L) 135 - 145 mmol/L   Potassium 3.8 3.5 - 5.1 mmol/L   Chloride 101 101 - 111 mmol/L   CO2 21 (L) 22 - 32 mmol/L   Glucose, Bld 99 65 - 99 mg/dL   BUN 15 6 - 20 mg/dL   Creatinine, Ser 0.95 0.61 - 1.24 mg/dL   Calcium 8.4 (L) 8.9 - 10.3 mg/dL   Total Protein 5.9 (L) 6.5 - 8.1 g/dL   Albumin 1.8 (L) 3.5 - 5.0 g/dL   AST 243 (H) 15 - 41 U/L   ALT 258 (H) 17 - 63 U/L   Alkaline Phosphatase 1,048 (H) 38 - 126 U/L   Total Bilirubin 19.0 (HH) 0.3 - 1.2 mg/dL    Comment: DELTA CHECK  NOTED CRITICAL RESULT CALLED TO, READ BACK BY AND VERIFIED WITH: PRBOTTE,P RN 2.8.18 @0600  ZANDO,C    GFR calc non Af Amer >60 >60 mL/min   GFR calc Af Amer >60 >60 mL/min    Comment: (NOTE) The eGFR has been calculated using the CKD EPI equation. This calculation has not been validated in all clinical situations. eGFR's persistently <60 mL/min signify possible Chronic Kidney Disease.    Anion gap 7 5 - 15  CBC with Differential/Platelet     Status: Abnormal   Collection Time: 08/17/16  5:13 AM  Result Value Ref Range   WBC 6.1 4.0 - 10.5 K/uL   RBC 2.57 (L) 4.22 - 5.81 MIL/uL   Hemoglobin 8.3 (L) 13.0 - 17.0 g/dL   HCT 25.1 (L) 39.0 - 52.0 %   MCV 97.7 78.0 - 100.0 fL   MCH 32.3 26.0 - 34.0 pg   MCHC 33.1 30.0 - 36.0 g/dL   RDW 17.1 (H) 11.5 - 15.5 %   Platelets 381 150 - 400 K/uL   Neutrophils Relative % 75 %   Neutro Abs 4.5 1.7 - 7.7 K/uL   Lymphocytes Relative 12 %   Lymphs Abs 0.7 0.7 - 4.0 K/uL   Monocytes Relative 9 %   Monocytes Absolute 0.6 0.1 - 1.0 K/uL   Eosinophils Relative 4 %   Eosinophils Absolute 0.3 0.0 - 0.7 K/uL   Basophils Relative 0 %   Basophils Absolute 0.0 0.0 - 0.1 K/uL    Imaging: Dg Chest 2 View  Result Date: 08/15/2016 CLINICAL DATA:  Cough and shortness of breath. Diarrhea. Dry and itchy skin. Symptoms began last month. Patient seen at urgent care and sent here for jaundice. Previous smoker. EXAM: CHEST  2 VIEW COMPARISON:  None. FINDINGS: The heart size and mediastinal contours are within normal limits. Both lungs are clear.  The visualized skeletal structures are unremarkable. IMPRESSION: No active cardiopulmonary disease. Electronically Signed   By: Lucienne Capers M.D.   On: 08/15/2016 23:03   Dg Abd 1 View  Result Date: 08/17/2016 CLINICAL DATA:  Unsuccessful ERCP, unable to cannulate CBD EXAM: ABDOMEN - 1 VIEW COMPARISON:  MR abdomen 08/16/2016 FLUOROSCOPY TIME:  1 minutes 25 seconds Radiation dose:  25.6 mGy FINDINGS: Submitted images  demonstrate failure of passage of a wire or catheter into the CBD. No contrast was injected. Visualized structures unremarkable. IMPRESSION: Failed ERCP. Electronically Signed   By: Lavonia Dana M.D.   On: 08/17/2016 13:00   US Abdomen Complete  Result Date: 08/16/2016 CLINICAL DATA:  Abnormal liver function studies.  Jaundice. EXAM: ABDOMEN ULTRASOUND COMPLETE COMPARISON:  None. FINDINGS: Gallbladder: Gallbladder is mildly distended and is diffusely sludge filled. No focal stones identified. No gallbladder wall thickening or edema. Murphy's sign is positive. Common bile duct: Diameter: 16 mm. Intra and extrahepatic bile ducts are dilated. No stone or obstructing lesion is identified but the distal common bile duct is not visualized due to bowel gas. Liver: Diffusely increased hepatic parenchymal echotexture suggesting fatty infiltration. No focal lesions identified. IVC: No abnormality visualized. Pancreas: Visualized portion unremarkable. Spleen: Size and appearance within normal limits. Right Kidney: Length: 12.7 cm. Echogenicity within normal limits. No mass or hydronephrosis visualized. Left Kidney: Length: 11.5 cm. Echogenicity within normal limits. No mass or hydronephrosis visualized. Abdominal aorta: No aneurysm visualized. Other findings: None. IMPRESSION: Gallbladder is distended and diffusely sludge filled with positive Murphy's sign. No stones are identified but changes could indicate cholecystitis. Intra and extrahepatic bile duct dilatation is present. No cause is identified but the distal common bile duct is not visualized. Electronically Signed   By: Lucienne Capers M.D.   On: 08/16/2016 00:40   Ct Abdomen Pelvis W Contrast  Result Date: 08/16/2016 CLINICAL DATA:  56 y/o M; elevated lipase and liver enzymes. Jaundice. EXAM: CT ABDOMEN AND PELVIS WITH CONTRAST TECHNIQUE: Multidetector CT imaging of the abdomen and pelvis was performed using the standard protocol following bolus administration  of intravenous contrast. CONTRAST:  100 cc Isovue-300. COMPARISON:  08/15/2016 abdominal ultrasound. FINDINGS: Lower chest: Mild centrilobular and paraseptal emphysematous changes. Hepatobiliary: Severe intra and extrahepatic biliary ductal dilatation as well as marked enlargement of the gallbladder. Ductal dilatation sharply tapers within the pancreatic head and there is question of a mass near the sphincter of Oddi measuring 15 mm (series 2, image 35). Otherwise there is no focal abnormality of the liver identified. Pancreas: No main duct dilatation of the pancreas or appreciable inflammatory change. Spleen: Normal in size without focal abnormality. Adrenals/Urinary Tract: Adrenal glands are unremarkable. Kidneys are normal, without renal calculi, focal lesion, or hydronephrosis. Bladder is unremarkable. Stomach/Bowel: Stomach is within normal limits. Appendix appears normal. No evidence of bowel wall thickening, distention, or inflammatory changes. Vascular/Lymphatic: Aortic atherosclerosis with mild calcification, no aneurysm. No enlarged abdominal or pelvic lymph nodes. Reproductive: Central prostatic calcifications. Other: No abdominal wall hernia or abnormality. No abdominopelvic ascites. Musculoskeletal: No acute or significant osseous findings. Transitional L5 vertebral body with articulation of transverse processes to the sacral ala and mild lumbar levocurvature. IMPRESSION: Severe intra and extrahepatic biliary ductal dilatation and gallbladder enlargement with sharp tapering to occlusion of the common bile duct within the pancreatic head. No obstructing radiopaque gallstone identified. Possible mass near the sphincter of Oddi. No pancreatic ductal dilatation. MRI/MRCP of the abdomen with and without contrast is recommended. Electronically Signed   By: Mia Creek  [place] M.D.   On: 08/16/2016 04:48   Mr 3d Recon At Scanner  Result Date: 08/16/2016 CLINICAL DATA:  Elevated lipase and liver  enzymes. Elevated bilirubin. Jaundice. Severe biliary obstruction on comparison CT. EXAM: MRI ABDOMEN WITHOUT AND WITH CONTRAST (INCLUDING MRCP) TECHNIQUE: Multiplanar multisequence MR imaging of the abdomen was performed both before and after the administration of intravenous contrast. Heavily T2-weighted images of the biliary and pancreatic ducts were obtained, and three-dimensional MRCP images were rendered by post processing. CONTRAST:  56m MULTIHANCE GADOBENATE DIMEGLUMINE 529 MG/ML IV SOLN COMPARISON:  CT 08/16/2016 FINDINGS: Lower chest:  Lung bases are clear. Hepatobiliary: Severe intrahepatic biliary duct dilatation. No focal hepatic lesion identified. No hepatic steatosis. Gallbladder is distended to 5 cm. No gallstones identified. The common hepatic duct and the combined bile duct are severe dilated with the common hepatic duct measuring 16 mm and the common bile duct measuring 13 mm. There is abrupt narrowing of the distal common bile duct at the level of the pancreatic head. This is approximately 10 mm from the ampulla. No enhancing lesion evident within the pancreatic head. No filling defect within the common bile duct. Pancreas: The pancreatic duct is normal caliber. Potential pancreas divisum variant anatomy. The no enhancing lesion to associated with the abrupt tapering of the common bile duct through the pancreatic head. Spleen: Normal spleen. Adrenals/urinary tract: Adrenal glands and kidneys are normal. Stomach/Bowel: Stomach and limited of the small bowel is unremarkable Vascular/Lymphatic: Abdominal aortic normal caliber. No retroperitoneal periportal lymphadenopathy. Musculoskeletal: No aggressive osseous lesion IMPRESSION: 1. Severe intrahepatic and extrahepatic biliary duct dilatation to the level of the pancreatic head with abrupt narrowing of the common bile duct approximately 10 mm from the ampulla. No obstructing lesion identified in the pancreatic head. No pancreatic duct dilatation.  No choledocholithiasis or cholelithiasis. Presumably occult obstructing lesion in the pancreatic head versus ampulla. Recommend ERCP for relief of obstruction and potential tissue sampling. 2. No pancreatic duct dilatation or inflammation. Potential pancreas divisum variant ductal anatomy. 3. Distended gallbladder.  No evidence cholelithiasis. Electronically Signed   By: SSuzy BouchardM.D.   On: 08/16/2016 12:14   Dg C-arm 61-120 Min-no Report  Result Date: 08/17/2016 Fluoroscopy was utilized by the requesting physician.  No radiographic interpretation.   Mr Abdomen Mrcp WMoise BoringContast  Result Date: 08/16/2016 CLINICAL DATA:  Elevated lipase and liver enzymes. Elevated bilirubin. Jaundice. Severe biliary obstruction on comparison CT. EXAM: MRI ABDOMEN WITHOUT AND WITH CONTRAST (INCLUDING MRCP) TECHNIQUE: Multiplanar multisequence MR imaging of the abdomen was performed both before and after the administration of intravenous contrast. Heavily T2-weighted images of the biliary and pancreatic ducts were obtained, and three-dimensional MRCP images were rendered by post processing. CONTRAST:  17mMULTIHANCE GADOBENATE DIMEGLUMINE 529 MG/ML IV SOLN COMPARISON:  CT 08/16/2016 FINDINGS: Lower chest:  Lung bases are clear. Hepatobiliary: Severe intrahepatic biliary duct dilatation. No focal hepatic lesion identified. No hepatic steatosis. Gallbladder is distended to 5 cm. No gallstones identified. The common hepatic duct and the combined bile duct are severe dilated with the common hepatic duct measuring 16 mm and the common bile duct measuring 13 mm. There is abrupt narrowing of the distal common bile duct at the level of the pancreatic head. This is approximately 10 mm from the ampulla. No enhancing lesion evident within the pancreatic head. No filling defect within the common bile duct. Pancreas: The pancreatic duct is normal caliber. Potential pancreas divisum variant anatomy. The no enhancing lesion to  associated with the abrupt  tapering of the common bile duct through the pancreatic head. Spleen: Normal spleen. Adrenals/urinary tract: Adrenal glands and kidneys are normal. Stomach/Bowel: Stomach and limited of the small bowel is unremarkable Vascular/Lymphatic: Abdominal aortic normal caliber. No retroperitoneal periportal lymphadenopathy. Musculoskeletal: No aggressive osseous lesion IMPRESSION: 1. Severe intrahepatic and extrahepatic biliary duct dilatation to the level of the pancreatic head with abrupt narrowing of the common bile duct approximately 10 mm from the ampulla. No obstructing lesion identified in the pancreatic head. No pancreatic duct dilatation. No choledocholithiasis or cholelithiasis. Presumably occult obstructing lesion in the pancreatic head versus ampulla. Recommend ERCP for relief of obstruction and potential tissue sampling. 2. No pancreatic duct dilatation or inflammation. Potential pancreas divisum variant ductal anatomy. 3. Distended gallbladder.  No evidence cholelithiasis. Electronically Signed   By: Suzy Bouchard M.D.   On: 08/16/2016 12:14    Assessment/Plan 1. 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  ERCP was unsuccessful at placement of a biliary stent.  We will plan to proceed with PTC drain placement tomorrow to relieve his obstruction.   -NPO p MN -hold heparin tomorrow prior to his procedure -zosyn as a prophylaxis on call to radiology prior to the procedure -I discussed with the patient that it was unclear right now how long this drain would remain in place.  I told him right now the drain would stay in indefinitely or until his obstruction could be relieve some other way.  He seems to understand this. -Risks and Benefits discussed with the patient including, but not limited to bleeding, infection, gallbladder perforation, bile leak, sepsis or even death. All of the patient's questions were  answered, patient is agreeable to proceed. Consent signed and in chart.  Thank you for this interesting consult.  I greatly enjoyed meeting Holland Nickson and look forward to participating in their care.  A copy of this report was sent to the requesting provider on this date.  Electronically Signed: Henreitta Cea 08/17/2016, 3:42 PM   I spent a total of 40 Minutes    in face to face in clinical consultation, greater than 50% of which was counseling/coordinating care for obstructive jaundice

## 2016-08-17 NOTE — Anesthesia Postprocedure Evaluation (Signed)
Anesthesia Post Note  Patient: Travis Pugh  Procedure(s) Performed: Procedure(s) (LRB): ENDOSCOPIC RETROGRADE CHOLANGIOPANCREATOGRAPHY (ERCP) (N/A)  Patient location during evaluation: Endoscopy Anesthesia Type: General Level of consciousness: awake and alert Pain management: pain level controlled Vital Signs Assessment: post-procedure vital signs reviewed and stable Respiratory status: spontaneous breathing, nonlabored ventilation, respiratory function stable and patient connected to nasal cannula oxygen Cardiovascular status: blood pressure returned to baseline and stable Postop Assessment: no signs of nausea or vomiting Anesthetic complications: no       Last Vitals:  Vitals:   08/17/16 1320 08/17/16 1330  BP:  132/74  Pulse: 82 82  Resp: 19 18  Temp:      Last Pain:  Vitals:   08/17/16 1300  TempSrc: Oral  PainSc:                  Cecile HearingStephen Edward Turk

## 2016-08-17 NOTE — Op Note (Signed)
[facility] Patient Name: Travis AsalRonald Pugh Procedure Date: 08/17/2016 MRN: [id number] Attending MD: Barrie FolkJohn C Fani Rotondo , MD Date of Birth: 1961-02-19 CSN: [id number] Age: 56 Admit Type: Inpatient Procedure:                ERCP Indications:              Bile duct obstruction Providers:                Everardo AllJohn C. Madilyn FiremanHayes, MD, Anthony Saraniel Madden, RN, Kandice RobinsonsGuillaume                            Awaka, Technician Referring MD:              Medicines:                General Anesthesia Complications:            No immediate complications. Estimated Blood Loss:     Estimated blood loss was minimal. Procedure:                Pre-Anesthesia Assessment:                           - Prior to the procedure, a History and Physical                            was performed, and patient medications and                            allergies were reviewed. The patient's tolerance of                            previous anesthesia was also reviewed. The risks                            and benefits of the procedure and the sedation                            options and risks were discussed with the patient.                            All questions were answered, and informed consent                            was obtained. Prior Anticoagulants: The patient has                            taken no previous anticoagulant or antiplatelet                            agents. ASA Grade Assessment: III - A patient with                            severe systemic disease. After reviewing the risks  and benefits, the patient was deemed in                            satisfactory condition to undergo the procedure.                           After obtaining informed consent, the scope was                            passed under direct vision. Throughout the                            procedure, the patient's blood pressure, pulse, and                            oxygen saturations were monitored  continuously. The                            [id number] ([id number]) scope was introduced through                            the mouth, and used to inject contrast into and                            used to inject contrast into the ventral pancreatic                            duct. The ERCP was technically difficult and                            complex due to challenging cannulation. The patient                            tolerated the procedure well. Findings:      The major papilla was bulging. The ventral pancreatic duct was deeply       cannulated. Contrast was injected. I personally interpreted the       pancreatic duct images. Ductal flow of contrast was adequate. Image       quality was adequate. Contrast extended to the distal pancreatic duct.       Opacification of the main pancreatic duct was done. The maximum diameter       of the ducts was 2 mm. The entire opacified area was normal. The bile       duct could not be cannulated. Impression:               - The major papilla appeared to be bulging. Moderate Sedation:      no moderate sedation Recommendation:           - Refer to an interventional radiologist today. Procedure Code(s):        --- Professional ---                           [id number], Endoscopic retrograde  cholangiopancreatography (ERCP); diagnostic,                            including collection of specimen(s) by brushing or                            washing, when performed (separate procedure) Diagnosis Code(s):        --- Professional ---                           K83.9, Disease of biliary tract, unspecified CPT copyright 2016 [employer] Medical Association. All rights reserved. The codes documented in this report are preliminary and upon coder review may  be revised to meet current compliance requirements. Barrie Folk, MD 08/17/2016 12:49:51 PM This report has been signed electronically. Number of Addenda: 0

## 2016-08-17 NOTE — Anesthesia Preprocedure Evaluation (Addendum)
Anesthesia Evaluation  Patient identified by MRN, date of birth, ID band Patient awake    Reviewed: Allergy & Precautions, NPO status , Patient's Chart, lab work & pertinent test results  Airway Mallampati: II  TM Distance: >3 FB Neck ROM: Full    Dental  (+) Teeth Intact, Dental Advisory Given, Loose,    Pulmonary neg pulmonary ROS, former smoker,    Pulmonary exam normal breath sounds clear to auscultation       Cardiovascular negative cardio ROS Normal cardiovascular exam Rhythm:Regular Rate:Normal     Neuro/Psych negative neurological ROS     GI/Hepatic negative GI ROS, Neg liver ROS, Common bile duct obstruction    Endo/Other  negative endocrine ROS  Renal/GU Renal disease (AKI)     Musculoskeletal negative musculoskeletal ROS (+)   Abdominal   Peds  Hematology  (+) Blood dyscrasia, anemia ,   Anesthesia Other Findings Day of surgery medications reviewed with the patient.  Reproductive/Obstetrics                            Anesthesia Physical Anesthesia Plan  ASA: II  Anesthesia Plan: General   Post-op Pain Management:    Induction: Intravenous  Airway Management Planned: Oral ETT  Additional Equipment:   Intra-op Plan:   Post-operative Plan: Extubation in OR  Informed Consent: I have reviewed the patients History and Physical, chart, labs and discussed the procedure including the risks, benefits and alternatives for the proposed anesthesia with the patient or authorized representative who has indicated his/her understanding and acceptance.   Dental advisory given  Plan Discussed with: CRNA  Anesthesia Plan Comments: (Risks/benefits of general anesthesia discussed with patient including risk of damage to teeth, lips, gum, and tongue, nausea/vomiting, allergic reactions to medications, and the possibility of heart attack, stroke and death.  All patient questions  answered.  Patient wishes to proceed.)        Anesthesia Quick Evaluation

## 2016-08-17 NOTE — Progress Notes (Signed)
ERCP attempted today with large bulging ampulla was seen. Unable to cannulate common bile duct despite multiple attempts. Suspect heart rate distal common duct obstruction prohibited. Will consult interventional radiology for possible PTC.

## 2016-08-17 NOTE — Transfer of Care (Signed)
Immediate Anesthesia Transfer of Care Note  Patient: [name]  Procedure(s) Performed: Procedure(s): ENDOSCOPIC RETROGRADE CHOLANGIOPANCREATOGRAPHY (ERCP) (N/A)  Patient Location: PACU  Anesthesia Type:General  Level of Consciousness: Patient easily awoken, sedated, comfortable, cooperative, following commands, responds to stimulation.   Airway & Oxygen Therapy: Patient spontaneously breathing, ventilating well, oxygen via simple oxygen mask.  Post-op Assessment: Report given to PACU RN, vital signs reviewed and stable, moving all extremities.   Post vital signs: Reviewed and stable.  Complications: No apparent anesthesia complications  Last Vitals:  Vitals:   08/17/16 0847 08/17/16 1025  BP: 126/84 139/86  Pulse: 78 74  Resp: 18 19  Temp: 37 C 36.7 C    Last Pain:  Vitals:   08/17/16 1025  TempSrc: Oral  PainSc:          Complications: No apparent anesthesia complications

## 2016-08-18 ENCOUNTER — Inpatient Hospital Stay (HOSPITAL_COMMUNITY): Payer: Medicare Other

## 2016-08-18 ENCOUNTER — Encounter (HOSPITAL_COMMUNITY): Payer: Self-pay | Admitting: Gastroenterology

## 2016-08-18 DIAGNOSIS — R7989 Other specified abnormal findings of blood chemistry: Secondary | ICD-10-CM

## 2016-08-18 DIAGNOSIS — R945 Abnormal results of liver function studies: Secondary | ICD-10-CM

## 2016-08-18 HISTORY — PX: IR GENERIC HISTORICAL: IMG1180011

## 2016-08-18 LAB — COMPREHENSIVE METABOLIC PANEL
ALK PHOS: 1007 U/L — AB (ref 38–126)
ALT: 232 U/L — AB (ref 17–63)
AST: 221 U/L — AB (ref 15–41)
Albumin: 1.7 g/dL — ABNORMAL LOW (ref 3.5–5.0)
Anion gap: 8 (ref 5–15)
BILIRUBIN TOTAL: 16.7 mg/dL — AB (ref 0.3–1.2)
BUN: 12 mg/dL (ref 6–20)
CALCIUM: 8.4 mg/dL — AB (ref 8.9–10.3)
CO2: 20 mmol/L — ABNORMAL LOW (ref 22–32)
CREATININE: 0.94 mg/dL (ref 0.61–1.24)
Chloride: 98 mmol/L — ABNORMAL LOW (ref 101–111)
Glucose, Bld: 101 mg/dL — ABNORMAL HIGH (ref 65–99)
Potassium: 3.4 mmol/L — ABNORMAL LOW (ref 3.5–5.1)
Sodium: 126 mmol/L — ABNORMAL LOW (ref 135–145)
TOTAL PROTEIN: 5.6 g/dL — AB (ref 6.5–8.1)

## 2016-08-18 LAB — CANCER ANTIGEN 19-9: CA 19 9: 1171 U/mL — AB (ref 0–35)

## 2016-08-18 MED ORDER — IOPAMIDOL (ISOVUE-300) INJECTION 61%
50.0000 mL | Freq: Once | INTRAVENOUS | Status: AC | PRN
  Administered 2016-08-18: 50 mL via INTRAVENOUS

## 2016-08-18 MED ORDER — LIDOCAINE HCL 1 % IJ SOLN
INTRAMUSCULAR | Status: AC
  Filled 2016-08-18: qty 20

## 2016-08-18 MED ORDER — MEPERIDINE HCL 100 MG/ML IJ SOLN
INTRAMUSCULAR | Status: AC
  Filled 2016-08-18: qty 1

## 2016-08-18 MED ORDER — MIDAZOLAM HCL 2 MG/2ML IJ SOLN
INTRAMUSCULAR | Status: AC
  Filled 2016-08-18: qty 6

## 2016-08-18 MED ORDER — MEPERIDINE HCL 25 MG/ML IJ SOLN
INTRAMUSCULAR | Status: AC | PRN
  Administered 2016-08-18: 25 mg via INTRAVENOUS

## 2016-08-18 MED ORDER — FENTANYL CITRATE (PF) 100 MCG/2ML IJ SOLN
INTRAMUSCULAR | Status: AC
  Filled 2016-08-18: qty 4

## 2016-08-18 MED ORDER — MIDAZOLAM HCL 2 MG/2ML IJ SOLN
INTRAMUSCULAR | Status: AC | PRN
  Administered 2016-08-18 (×5): 1 mg via INTRAVENOUS

## 2016-08-18 MED ORDER — IOPAMIDOL (ISOVUE-300) INJECTION 61%
INTRAVENOUS | Status: AC
  Administered 2016-08-18: 50 mL via INTRAVENOUS
  Filled 2016-08-18: qty 50

## 2016-08-18 MED ORDER — FENTANYL CITRATE (PF) 100 MCG/2ML IJ SOLN
INTRAMUSCULAR | Status: AC | PRN
  Administered 2016-08-18 (×4): 25 ug via INTRAVENOUS
  Administered 2016-08-18: 50 ug via INTRAVENOUS

## 2016-08-18 MED ORDER — LIDOCAINE HCL 1 % IJ SOLN
INTRAMUSCULAR | Status: AC | PRN
  Administered 2016-08-18: 10 mL

## 2016-08-18 NOTE — Progress Notes (Signed)
[name]  Subjective: Patient feeling okay, no abdominal pain. Understands yesterday and today's procedure results and rationale.  Objective: Vital signs in last 24 hours: Temp:  [97.6 F (36.4 C)-98.8 F (37.1 C)] 98.8 F (37.1 C) (02/09 0639) Pulse Rate:  [70-88] 70 (02/09 0639) Resp:  [15-24] 18 (02/09 0639) BP: (112-139)/(62-90) 118/80 (02/09 0639) SpO2:  [100 %] 100 % (02/09 0639) Weight change:    PE: Unchanged  Lab Results: Results for orders placed or performed during the hospital encounter of 08/15/16 (from the past 24 hour(s))  Cancer antigen 19-9     Status: Abnormal   Collection Time: 08/17/16  2:06 PM  Result Value Ref Range   CA 19-9 1,171 (H) 0 - 35 U/mL  Comprehensive metabolic panel     Status: Abnormal   Collection Time: 08/18/16  5:12 AM  Result Value Ref Range   Sodium 126 (L) 135 - 145 mmol/L   Potassium 3.4 (L) 3.5 - 5.1 mmol/L   Chloride 98 (L) 101 - 111 mmol/L   CO2 20 (L) 22 - 32 mmol/L   Glucose, Bld 101 (H) 65 - 99 mg/dL   BUN 12 6 - 20 mg/dL   Creatinine, Ser 0.94 0.61 - 1.24 mg/dL   Calcium 8.4 (L) 8.9 - 10.3 mg/dL   Total Protein 5.6 (L) 6.5 - 8.1 g/dL   Albumin 1.7 (L) 3.5 - 5.0 g/dL   AST 221 (H) 15 - 41 U/L   ALT 232 (H) 17 - 63 U/L   Alkaline Phosphatase 1,007 (H) 38 - 126 U/L   Total Bilirubin 16.7 (H) 0.3 - 1.2 mg/dL   GFR calc non Af Amer >60 >60 mL/min   GFR calc Af Amer >60 >60 mL/min   Anion gap 8 5 - 15    Studies/Results: Dg Abd 1 View  Result Date: 08/17/2016 CLINICAL DATA:  Unsuccessful ERCP, unable to cannulate CBD EXAM: ABDOMEN - 1 VIEW COMPARISON:  MR abdomen 08/16/2016 FLUOROSCOPY TIME:  1 minutes 25 seconds Radiation dose:  25.6 mGy FINDINGS: Submitted images demonstrate failure of passage of a wire or catheter into the CBD. No contrast was injected. Visualized structures unremarkable. IMPRESSION: Failed ERCP. Electronically Signed   By: Lavonia Dana M.D.   On: 08/17/2016 13:00   Mr 3d  Recon At Scanner  Result Date: 08/16/2016 CLINICAL DATA:  Elevated lipase and liver enzymes. Elevated bilirubin. Jaundice. Severe biliary obstruction on comparison CT. EXAM: MRI ABDOMEN WITHOUT AND WITH CONTRAST (INCLUDING MRCP) TECHNIQUE: Multiplanar multisequence MR imaging of the abdomen was performed both before and after the administration of intravenous contrast. Heavily T2-weighted images of the biliary and pancreatic ducts were obtained, and three-dimensional MRCP images were rendered by post processing. CONTRAST:  4m MULTIHANCE GADOBENATE DIMEGLUMINE 529 MG/ML IV SOLN COMPARISON:  CT 08/16/2016 FINDINGS: Lower chest:  Lung bases are clear. Hepatobiliary: Severe intrahepatic biliary duct dilatation. No focal hepatic lesion identified. No hepatic steatosis. Gallbladder is distended to 5 cm. No gallstones identified. The common hepatic duct and the combined bile duct are severe dilated with the common hepatic duct measuring 16 mm and the common bile duct measuring 13 mm. There is abrupt narrowing of the distal common bile duct at the level of the pancreatic head. This is approximately 10 mm from the ampulla. No enhancing lesion evident within the pancreatic head. No filling defect within the common bile duct. Pancreas: The pancreatic duct is normal caliber. Potential pancreas divisum variant anatomy. The no enhancing lesion to associated with  the abrupt tapering of the common bile duct through the pancreatic head. Spleen: Normal spleen. Adrenals/urinary tract: Adrenal glands and kidneys are normal. Stomach/Bowel: Stomach and limited of the small bowel is unremarkable Vascular/Lymphatic: Abdominal aortic normal caliber. No retroperitoneal periportal lymphadenopathy. Musculoskeletal: No aggressive osseous lesion IMPRESSION: 1. Severe intrahepatic and extrahepatic biliary duct dilatation to the level of the pancreatic head with abrupt narrowing of the common bile duct approximately 10 mm from the ampulla. No  obstructing lesion identified in the pancreatic head. No pancreatic duct dilatation. No choledocholithiasis or cholelithiasis. Presumably occult obstructing lesion in the pancreatic head versus ampulla. Recommend ERCP for relief of obstruction and potential tissue sampling. 2. No pancreatic duct dilatation or inflammation. Potential pancreas divisum variant ductal anatomy. 3. Distended gallbladder.  No evidence cholelithiasis. Electronically Signed   By: Suzy Bouchard M.D.   On: 08/16/2016 12:14   Dg C-arm 61-120 Min-no Report  Result Date: 08/17/2016 Fluoroscopy was utilized by the requesting physician.  No radiographic interpretation.   Mr Abdomen Mrcp Moise Boring Contast  Result Date: 08/16/2016 CLINICAL DATA:  Elevated lipase and liver enzymes. Elevated bilirubin. Jaundice. Severe biliary obstruction on comparison CT. EXAM: MRI ABDOMEN WITHOUT AND WITH CONTRAST (INCLUDING MRCP) TECHNIQUE: Multiplanar multisequence MR imaging of the abdomen was performed both before and after the administration of intravenous contrast. Heavily T2-weighted images of the biliary and pancreatic ducts were obtained, and three-dimensional MRCP images were rendered by post processing. CONTRAST:  37m MULTIHANCE GADOBENATE DIMEGLUMINE 529 MG/ML IV SOLN COMPARISON:  CT 08/16/2016 FINDINGS: Lower chest:  Lung bases are clear. Hepatobiliary: Severe intrahepatic biliary duct dilatation. No focal hepatic lesion identified. No hepatic steatosis. Gallbladder is distended to 5 cm. No gallstones identified. The common hepatic duct and the combined bile duct are severe dilated with the common hepatic duct measuring 16 mm and the common bile duct measuring 13 mm. There is abrupt narrowing of the distal common bile duct at the level of the pancreatic head. This is approximately 10 mm from the ampulla. No enhancing lesion evident within the pancreatic head. No filling defect within the common bile duct. Pancreas: The pancreatic duct is normal  caliber. Potential pancreas divisum variant anatomy. The no enhancing lesion to associated with the abrupt tapering of the common bile duct through the pancreatic head. Spleen: Normal spleen. Adrenals/urinary tract: Adrenal glands and kidneys are normal. Stomach/Bowel: Stomach and limited of the small bowel is unremarkable Vascular/Lymphatic: Abdominal aortic normal caliber. No retroperitoneal periportal lymphadenopathy. Musculoskeletal: No aggressive osseous lesion IMPRESSION: 1. Severe intrahepatic and extrahepatic biliary duct dilatation to the level of the pancreatic head with abrupt narrowing of the common bile duct approximately 10 mm from the ampulla. No obstructing lesion identified in the pancreatic head. No pancreatic duct dilatation. No choledocholithiasis or cholelithiasis. Presumably occult obstructing lesion in the pancreatic head versus ampulla. Recommend ERCP for relief of obstruction and potential tissue sampling. 2. No pancreatic duct dilatation or inflammation. Potential pancreas divisum variant ductal anatomy. 3. Distended gallbladder.  No evidence cholelithiasis. Electronically Signed   By: SSuzy BouchardM.D.   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 We'll follow-up tomorrow.    Ciel Yanes C 08/18/2016, 10:09 AM  Pager [phone] no answer or after 5 PM call [phone]

## 2016-08-18 NOTE — Progress Notes (Signed)
MEDICATION-RELATED CONSULT NOTE   IR Procedure Consult - Anticoagulant/Antiplatelet PTA/Inpatient Med List Review by Pharmacist    Procedure: Formal PTC, with placement of internal/external biliary drain across obstruction at the distal CBD.    Completed: 2/9 @ 15:40  Post-Procedural bleeding risk per IR MD assessment:  LOW  Antithrombotic medications on inpatient or PTA profile prior to procedure:   SQ hep    Recommended restart time per IR Post-Procedure Guidelines: Day0; at least 4 hrs    Other considerations:   No complications   Plan:      Heparin to resume at 2200 tonight (orders placed by IR)  Travis Pugh, PharmD, BCPS Pager: [phone] 08/18/2016, 6:00 PM

## 2016-08-18 NOTE — Procedures (Signed)
Interventional Radiology Procedure Note  Procedure: Formal PTC, with placement of internal/external biliary drain across obstruction at the distal CBD.     32F int/ext drain placed.   Complications: None  Specimen: Sample of bile for cx  EBL: 0  Recommendations:  - Maintain to gravity drainage  - follow up culture - BID drain flush with sterile saline.  DO NOT ASPIRATE drain.  - record daily output from drain until capped - Wound advise a 24 hour capping trial before DC home to make sure the drain remains patent.  - Schedule for 6 week follow up at hospital for drain injection and possible exchange with VIR. - Do not submerge - Routine care   Signed,  Dulcy Fanny. Earleen Newport, DO

## 2016-08-18 NOTE — Progress Notes (Signed)
[name] PROGRESS NOTE  Travis Pugh WGN:[id number] DOB: May 25, 1961 DOA: 08/15/2016 PCP: [name] RilyJONES,ENRICO G, MD  Interim summary and HPI 56 y.o. male with no significant PMHx, presented to the ED c/o severe jaundice first noted last night. Patient was seen his PCP, due to feeling tired, labs were drawn and patient was told to come to ED.  Patient stated that his sister noted his eyes to be yellow last night and told him to come to the ED. Patient reports itchiness is the only associated symptoms at this time, but recalls having diarrhea, nausea and vomiting about 2 weeks ago that resolved on its own. Patient also report feeling weak for the past 2 weeks. Denies abdominal pain. Denies drug use or casual sex. No recent travel.  CT Abdomen shows severe intra and extrahepatic ductal duct dilatation and gallbladder enlargement   Assessment/Plan: Common bile duct obstruction/Painless jaundice  -s/p ERCP with failure to place stent -currently no indication for surgical intervention. Will follow tissue brushes and pursuit PCT by IR. Base on results might discussed with CCS again (for dedicated surgical resection needs) -will follow LFT's (improved today again, in comparison with admission values) -continue supportive care -CA-19-9 1171  AKI - due to dehydration, unknown Cr baseline  -will continue IVF's, rate adjusted -minimize/avoid nephrotoxic agents -repeated Cr back to baseline now (0.95)  Dehydration with Hyponatremia - likely from dehydration - asymptomatic  -continue IVF's -electrolytes improving appropriately -will follow CMET in am  Anemia - unknown etiology at this time. No signs of overt bleeding  -Anemia panel suggesting anemia or chronic disease (ferritin 1600 range) -Transfuse if Hgb < 7  -CBC in am  Code Status: Full Family Communication: no family at bedside  Disposition Plan: remains inpatient for now; IR for PCT later today. Will follow  LFT's   Consultants: GI IR  Procedures: See below for x-ray reports ERCP: unable to have placement of stent Planning PCT cholecystostomy on 08/18/16  Antibiotics:  None   HPI/Subjective: Patient is afebrile, denies CP and SOB. Patient is icteric. Denies abd pain, nausea and vomiting.  Objective: Vitals:   08/18/16 0639 08/18/16 1400  BP: 118/80 111/76  Pulse: 70 83  Resp: 18 17  Temp: 98.8 F (37.1 C) 97.7 F (36.5 C)    Intake/Output Summary (Last 24 hours) at 08/18/16 1443 Last data filed at 08/18/16 1405  Gross per 24 hour  Intake          3784.67 ml  Output             2410 ml  Net          1374.67 ml   Filed Weights   08/15/16 1703  Weight: 70.3 kg (155 lb)    Exam:   General:  Afebrile, slightly somnolent after ERCP. No nausea or vomiting. Jaundice appreciated   Cardiovascular: S1 and s2, no rubs or gallops  Respiratory: CTA bilaterally  Abdomen: soft, NT, ND, positive BS  Musculoskeletal: no edema no cyanosis or clubbing   Data Reviewed: Basic Metabolic Panel:  Recent Labs Lab 08/15/16 2222 08/17/16 0513 08/18/16 0512  NA 126* 129* 126*  K 3.6 3.8 3.4*  CL 95* 101 98*  CO2 21* 21* 20*  GLUCOSE 98 99 101*  BUN 24* 15 12  CREATININE 1.36* 0.95 0.94  CALCIUM 8.8* 8.4* 8.4*   Liver Function Tests:  Recent Labs Lab 08/15/16 2222 08/17/16 0513 08/18/16 0512  AST 407* 243* 221*  ALT 330* 258* 232*  ALKPHOS 1,140* 1,048* 1,007*  BILITOT 24.9* 19.0* 16.7*  PROT 6.9 5.9* 5.6*  ALBUMIN 2.2* 1.8* 1.7*    Recent Labs Lab 08/15/16 2222  LIPASE 117*   CBC:  Recent Labs Lab 08/15/16 2222 08/17/16 0513  WBC 8.3 6.1  NEUTROABS  --  4.5  HGB 8.8* 8.3*  HCT 25.9* 25.1*  MCV 97.4 97.7  PLT 373 381    Studies: Dg Abd 1 View  Result Date: 08/17/2016 CLINICAL DATA:  Unsuccessful ERCP, unable to cannulate CBD EXAM: ABDOMEN - 1 VIEW COMPARISON:  MR abdomen 08/16/2016 FLUOROSCOPY TIME:  1 minutes 25 seconds Radiation dose:  25.6  mGy FINDINGS: Submitted images demonstrate failure of passage of a wire or catheter into the CBD. No contrast was injected. Visualized structures unremarkable. IMPRESSION: Failed ERCP. Electronically Signed   By: Ulyses Southward M.D.   On: 08/17/2016 13:00   Dg C-arm 61-120 Min-no Report  Result Date: 08/17/2016 Fluoroscopy was utilized by the requesting physician.  No radiographic interpretation.    Scheduled Meds: . fentaNYL      . midazolam      . heparin  5,000 Units Subcutaneous Q8H  . iopamidol      . lidocaine      . piperacillin-tazobactam (ZOSYN)  IV  3.375 g Intravenous to XRAY   Continuous Infusions: . sodium chloride 100 mL/hr (08/18/16 1252)  . lactated ringers      Active Problems:   Jaundice   Common bile duct (CBD) obstruction   AKI (acute kidney injury) (HCC)   Anemia    Time spent: 25 minutes    Travis Pugh  [name] Pager [phone]. If 7PM-7AM, please contact night-coverage at [email or web address], password [facility] 08/18/2016, 2:43 PM  LOS: 2 days

## 2016-08-19 LAB — COMPREHENSIVE METABOLIC PANEL
ALT: 214 U/L — ABNORMAL HIGH (ref 17–63)
ANION GAP: 7 (ref 5–15)
AST: 180 U/L — ABNORMAL HIGH (ref 15–41)
Albumin: 1.7 g/dL — ABNORMAL LOW (ref 3.5–5.0)
Alkaline Phosphatase: 951 U/L — ABNORMAL HIGH (ref 38–126)
BUN: 13 mg/dL (ref 6–20)
CHLORIDE: 100 mmol/L — AB (ref 101–111)
CO2: 23 mmol/L (ref 22–32)
Calcium: 8.2 mg/dL — ABNORMAL LOW (ref 8.9–10.3)
Creatinine, Ser: 1.08 mg/dL (ref 0.61–1.24)
Glucose, Bld: 110 mg/dL — ABNORMAL HIGH (ref 65–99)
POTASSIUM: 3.4 mmol/L — AB (ref 3.5–5.1)
Sodium: 130 mmol/L — ABNORMAL LOW (ref 135–145)
Total Bilirubin: 10.6 mg/dL — ABNORMAL HIGH (ref 0.3–1.2)
Total Protein: 5.7 g/dL — ABNORMAL LOW (ref 6.5–8.1)

## 2016-08-19 LAB — CBC
HCT: 27.1 % — ABNORMAL LOW (ref 39.0–52.0)
Hemoglobin: 9 g/dL — ABNORMAL LOW (ref 13.0–17.0)
MCH: 32.8 pg (ref 26.0–34.0)
MCHC: 33.2 g/dL (ref 30.0–36.0)
MCV: 98.9 fL (ref 78.0–100.0)
PLATELETS: 397 10*3/uL (ref 150–400)
RBC: 2.74 MIL/uL — ABNORMAL LOW (ref 4.22–5.81)
RDW: 16.7 % — AB (ref 11.5–15.5)
WBC: 9.5 10*3/uL (ref 4.0–10.5)

## 2016-08-19 MED ORDER — POTASSIUM CHLORIDE CRYS ER 20 MEQ PO TBCR
40.0000 meq | EXTENDED_RELEASE_TABLET | ORAL | Status: AC
  Administered 2016-08-19 (×2): 40 meq via ORAL
  Filled 2016-08-19 (×2): qty 2

## 2016-08-19 NOTE — Progress Notes (Signed)
Chief Complaint: Patient was seen today for follow up PTC  Referring Physician(s): Dr. Teena Irani  Supervising Physician: Aletta Edouard  Patient Status: [facility] - In-pt  Subjective: Pt is s/p PTC with placement of I/E biliary drain. Says he had a rough night. Not so much pain, but rather N/V. He is having some soreness at the drain site, but says he was having more central abdominal pain last night. This seems to have improved this morning.  Objective: Physical Exam: BP 120/80 (BP Location: Left Arm)   Pulse 74   Temp 98.2 F (36.8 C) (Oral)   Resp 16   Ht 5' 9"  (1.753 m)   Wt 155 lb (70.3 kg)   SpO2 100%   BMI 22.89 kg/m  (R)flank biliary drain intact, site clean. Output biliary, some sludge.   Current Facility-Administered Medications:  .  0.9 %  sodium chloride infusion, , Intravenous, Continuous, Doreatha Lew, MD, Last Rate: 100 mL/hr at 08/19/16 0551, 100 mL/hr at 08/19/16 0551 .  acetaminophen (TYLENOL) tablet 650 mg, 650 mg, Oral, Q6H PRN, 650 mg at 08/19/16 0148 **OR** acetaminophen (TYLENOL) suppository 650 mg, 650 mg, Rectal, Q6H PRN, Doreatha Lew, MD .  heparin injection 5,000 Units, 5,000 Units, Subcutaneous, Q8H, Saverio Danker, PA-C, 5,000 Units at 08/19/16 [phone] .  lactated ringers infusion, , Intravenous, Continuous, Teena Irani, MD .  ondansetron [facility]) tablet 4 mg, 4 mg, Oral, Q6H PRN **OR** ondansetron (ZOFRAN) injection 4 mg, 4 mg, Intravenous, Q6H PRN, Doreatha Lew, MD, 4 mg at 08/19/16 0143 .  senna-docusate (Senokot-S) tablet 1 tablet, 1 tablet, Oral, QHS PRN, Doreatha Lew, MD  Labs: CBC  Recent Labs  08/17/16 0513 08/19/16 0503  WBC 6.1 9.5  HGB 8.3* 9.0*  HCT 25.1* 27.1*  PLT 381 397   BMET  Recent Labs  08/18/16 0512 08/19/16 0503  NA 126* 130*  K 3.4* 3.4*  CL 98* 100*  CO2 20* 23  GLUCOSE 101* 110*  BUN 12 13  CREATININE 0.94 1.08  CALCIUM 8.4* 8.2*   LFT  Recent Labs  08/19/16 0503  PROT 5.7*   ALBUMIN 1.7*  AST 180*  ALT 214*  ALKPHOS 951*  BILITOT 10.6*   PT/INR No results for input(s): LABPROT, INR in the last 72 hours.   Studies/Results: Dg Abd 1 View  Result Date: 08/17/2016 CLINICAL DATA:  Unsuccessful ERCP, unable to cannulate CBD EXAM: ABDOMEN - 1 VIEW COMPARISON:  MR abdomen 08/16/2016 FLUOROSCOPY TIME:  1 minutes 25 seconds Radiation dose:  25.6 mGy FINDINGS: Submitted images demonstrate failure of passage of a wire or catheter into the CBD. No contrast was injected. Visualized structures unremarkable. IMPRESSION: Failed ERCP. Electronically Signed   By: Lavonia Dana M.D.   On: 08/17/2016 13:00   Dg C-arm 61-120 Min-no Report  Result Date: 08/17/2016 Fluoroscopy was utilized by the requesting physician.  No radiographic interpretation.   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 He has been referred for PTC and drainage. EXAM: IR CHOLANGIOGRAM PERCUTANEOUS TRANSHEPATIC MEDICATIONS: 25 mg IV Demerol ANESTHESIA/SEDATION: Moderate (conscious) sedation was employed during this procedure. A total of Versed 5.0 mg and Fentanyl 150 mcg was administered intravenously. Moderate Sedation Time: 32 minutes. The patient's level of consciousness and vital signs were monitored continuously by radiology nursing throughout the procedure under my direct supervision. FLUOROSCOPY TIME:  Fluoroscopy Time: 6.0 minutes 0 seconds (96.7 mGy).  COMPLICATIONS: None PROCEDURE: The procedure, risks, benefits, and alternatives were explained to the patient and the patient's family. A complete informed consent was performed, with risk benefit analysis. Specific risks that were discussed for the procedure include bleeding, infection, biliary sepsis, IC use day, organ injury, need for further procedure, need for further surgery, long-term drain placement, cardiopulmonary collapse, death. Questions  regarding the procedure were encouraged and answered. The patient understands and consents to the procedure. Patient is position in supine position on the fluoroscopy table, and the upper abdomen was prepped and draped in the usual sterile fashion. Maximum barrier sterile technique with sterile gowns and gloves were used for the procedure. A timeout was performed prior to the initiation of the procedure. Local anesthesia was provided with 1% lidocaine with epinephrine. Ultrasound survey of the right liver lobe was performed. Window into the right biliary system was present. 1% lidocaine was used for local anesthesia, with generous infiltration of the skin and subcutaneous tissues in and inter left costal location. A Chiba needle was advanced under ultrasound guidance into the right liver lobe. Once the tip of the needle was confirmed within the biliary system by injecting small aliquots of contrast, images were stored of the biliary system after partially opacifying the biliary tree via the needle. An 018 wire was advanced centrally. The needle was removed, a small incision was made with an 11 blade scalpel, and then a triaxial Accustick system was advanced into the biliary system. The metal stiffener and dilator were removed, we confirmed placement with contrast infusion. A coaxial Glidewire and 4 French glide cath were then used to navigate across the obstruction at the ampulla. Once the catheter was advanced to the duodenum, the wire was removed and contrast confirmed location. A Coons wire was advanced through the system, and the Accustick and Glidewire were removed. Dilation of the subcutaneous tissue tracks was performed with an 8 [place] dilator, and then a 10 [place] biliary drain was placed as an internal/external biliary drain. Small amount of contrast confirmed location. The patient tolerated the procedure well and remained hemodynamically stable throughout. No complications were encountered and no  significant blood loss was encountered. IMPRESSION: Status post percutaneous transhepatic cholangiogram with placement of internal/external biliary drain across ampullary obstruction. Sample of bile sent the to lab for analysis. Signed, Dulcy Fanny. Earleen Newport, DO [facility] Radiology Electronically Signed   By: Corrie Mckusick D.O.   On: 08/18/2016 16:13    Assessment/Plan: Obstructive jaundice S/p PTC with I/E biliary drain 2/9 T Bili down to 10.6. COnt to gravity drain IR following    LOS: 3 days   I spent a total of 15 minutes in face to face in clinical consultation, greater than 50% of which was counseling/coordinating care for PTC/biliary drain  Ascencion Dike PA-C 08/19/2016 8:29 AM

## 2016-08-19 NOTE — Progress Notes (Signed)
[name] PROGRESS NOTE  [facility] DXA:[id number] DOB: 03/11/1961 DOA: 08/15/2016 PCP: Andria Frames, MD  Interim summary and HPI 56 y.o. male with no significant PMHx, presented to the ED c/o severe jaundice first noted last night. Patient was seen his PCP, due to feeling tired, labs were drawn and patient was told to come to ED.  Patient stated that his sister noted his eyes to be yellow last night and told him to come to the ED. Patient reports itchiness is the only associated symptoms at this time, but recalls having diarrhea, nausea and vomiting about 2 weeks ago that resolved on its own. Patient also report feeling weak for the past 2 weeks. Denies abdominal pain. Denies drug use or casual sex. No recent travel.  CT Abdomen shows severe intra and extrahepatic ductal duct dilatation and gallbladder enlargement   Assessment/Plan: Common bile duct obstruction/Painless jaundice  -s/p ERCP with failure to place stent -currently no indication for surgical intervention. Will follow tissue brushes and pursuit PCT by IR. Base on results might discussed with [facility] again (for dedicated surgical resection needs) -will follow LFT's (continue improving and trending down) -continue supportive care -[id number] -will follow biliary fluid cytology  -will advance diet to soft as per GI rec's  AKI - due to dehydration, unknown Cr baseline  -will continue IVF's, rate adjusted -minimize/avoid nephrotoxic agents -repeated Cr back to baseline now (0.95)  Dehydration with Hyponatremia - likely from dehydration - asymptomatic  -continue IVF's -electrolytes improving appropriately -will follow CMET in am again  Anemia - unknown etiology at this time. No signs of overt bleeding  -Anemia panel suggesting anemia or chronic disease (ferritin 1600 range) -Transfuse if Hgb < 7  -CBC today with hgb 9.8 (2/10)  Code Status: Full Family Communication: no family at bedside  Disposition Plan:  remains inpatient for now; S/P PCT by IR. LFT's trending down. Will advance diet and follow biliary fluid cytology.   Consultants: GI IR  Procedures: See below for x-ray reports ERCP: unable to have placement of stent Planning PCT 08/18/16  Antibiotics:  None   HPI/Subjective: Patient is afebrile, denies CP and SOB. Patient is icteric (but less today). Reports ome pain in his abd and 1 episode of vomiting overnight.  Objective: Vitals:   08/19/16 0530 08/19/16 1442  BP: 120/80 138/87  Pulse: 74 80  Resp: 16 16  Temp: 98.2 F (36.8 C) 98.2 F (36.8 C)    Intake/Output Summary (Last 24 hours) at 08/19/16 1508 Last data filed at 08/19/16 1442  Gross per 24 hour  Intake          2406.67 ml  Output             5150 ml  Net         -2743.33 ml   Filed Weights   08/15/16 1703  Weight: 70.3 kg (155 lb)    Exam:   General:  Afebrile. Status post PCT by IR, had some abd pain and 1 episode of vomiting overnight. Hungry now and denying nausea. Jaundice appreciated (even slightly clearer)   Cardiovascular: S1 and s2, no rubs or gallops  Respiratory: CTA bilaterally  Abdomen: soft, NT, ND, positive BS  Musculoskeletal: no edema no cyanosis or clubbing   Data Reviewed: Basic Metabolic Panel:  Recent Labs Lab 08/15/16 2222 08/17/16 0513 08/18/16 0512 08/19/16 0503  NA 126* 129* 126* 130*  K 3.6 3.8 3.4* 3.4*  CL 95* 101 98* 100*  CO2 21* 21* 20* 23  GLUCOSE 98 99 101* 110*  BUN 24* 15 12 13   CREATININE 1.36* 0.95 0.94 1.08  CALCIUM 8.8* 8.4* 8.4* 8.2*   Liver Function Tests:  Recent Labs Lab 08/15/16 2222 08/17/16 0513 08/18/16 0512 08/19/16 0503  AST 407* 243* 221* 180*  ALT 330* 258* 232* 214*  ALKPHOS 1,140* 1,048* 1,007* 951*  BILITOT 24.9* 19.0* 16.7* 10.6*  PROT 6.9 5.9* 5.6* 5.7*  ALBUMIN 2.2* 1.8* 1.7* 1.7*    Recent Labs Lab 08/15/16 2222  LIPASE 117*   CBC:  Recent Labs Lab 08/15/16 2222 08/17/16 0513 08/19/16 0503  WBC 8.3  6.1 9.5  NEUTROABS  --  4.5  --   HGB 8.8* 8.3* 9.0*  HCT 25.9* 25.1* 27.1*  MCV 97.4 97.7 98.9  PLT 373 381 397    Studies: Ir Int Lianne Cure Biliary Drain With Cholangiogram  Result Date: 08/18/2016 INDICATION: 56 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 He has been referred for PTC and drainage. EXAM: IR CHOLANGIOGRAM PERCUTANEOUS TRANSHEPATIC MEDICATIONS: 25 mg IV Demerol ANESTHESIA/SEDATION: Moderate (conscious) sedation was employed during this procedure. A total of Versed 5.0 mg and Fentanyl 150 mcg was administered intravenously. Moderate Sedation Time: 32 minutes. The patient's level of consciousness and vital signs were monitored continuously by radiology nursing throughout the procedure under my direct supervision. FLUOROSCOPY TIME:  Fluoroscopy Time: 6.0 minutes 0 seconds (96.7 mGy). COMPLICATIONS: None PROCEDURE: The procedure, risks, benefits, and alternatives were explained to the patient and the patient's family. A complete informed consent was performed, with risk benefit analysis. Specific risks that were discussed for the procedure include bleeding, infection, biliary sepsis, IC use day, organ injury, need for further procedure, need for further surgery, long-term drain placement, cardiopulmonary collapse, death. Questions regarding the procedure were encouraged and answered. The patient understands and consents to the procedure. Patient is position in supine position on the fluoroscopy table, and the upper abdomen was prepped and draped in the usual sterile fashion. Maximum barrier sterile technique with sterile gowns and gloves were used for the procedure. A timeout was performed prior to the initiation of the procedure. Local anesthesia was provided with 1% lidocaine with epinephrine. Ultrasound survey of the right liver lobe was performed. Window into the right biliary system was present. 1% lidocaine was used for local anesthesia, with generous infiltration  of the skin and subcutaneous tissues in and inter left costal location. A Chiba needle was advanced under ultrasound guidance into the right liver lobe. Once the tip of the needle was confirmed within the biliary system by injecting small aliquots of contrast, images were stored of the biliary system after partially opacifying the biliary tree via the needle. An 018 wire was advanced centrally. The needle was removed, a small incision was made with an 11 blade scalpel, and then a triaxial Accustick system was advanced into the biliary system. The metal stiffener and dilator were removed, we confirmed placement with contrast infusion. A coaxial Glidewire and 4 French glide cath were then used to navigate across the obstruction at the ampulla. Once the catheter was advanced to the duodenum, the wire was removed and contrast confirmed location. A Coons wire was advanced through the system, and the Accustick and Glidewire were removed. Dilation of the subcutaneous tissue tracks was performed with an 8 [place] dilator, and then a 10 [place] biliary drain was placed as an internal/external biliary drain. Small amount of contrast confirmed location. The patient tolerated the procedure well and remained hemodynamically stable throughout. No complications  were encountered and no significant blood loss was encountered. IMPRESSION: Status post percutaneous transhepatic cholangiogram with placement of internal/external biliary drain across ampullary obstruction. Sample of bile sent the to lab for analysis. Signed, Dulcy Fanny. Earleen Newport, DO [facility] Radiology Electronically Signed   By: Corrie Mckusick D.O.   On: 08/18/2016 16:13    Scheduled Meds: . heparin  5,000 Units Subcutaneous Q8H   Continuous Infusions: . sodium chloride 100 mL/hr (08/19/16 0551)  . lactated ringers      Active Problems:   Jaundice   Bile duct obstruction   AKI (acute kidney injury) ([place])    Anemia   Elevated LFTs   Time spent: 25 minutes   Barton Dubois  [name] Pager [phone]. If 7PM-7AM, please contact night-coverage at [email or web address], password [facility] 08/19/2016, 3:08 PM  LOS: 3 days

## 2016-08-19 NOTE — Progress Notes (Signed)
[name]  Subjective: The patient had a PTC with biliary drainage yesterday. Seems to be doing okay today. Has some abdominal discomfort but not severe.  Objective: Vital signs in last 24 hours: Temp:  [97.2 F (36.2 C)-98.3 F (36.8 C)] 98.2 F (36.8 C) (02/10 0530) Pulse Rate:  [68-86] 74 (02/10 0530) Resp:  [12-22] 16 (02/10 0530) BP: (93-124)/(62-84) 120/80 (02/10 0530) SpO2:  [99 %-100 %] 100 % (02/10 0530) Weight change:    PE:  No distress  Abdomen soft, nontender  Lab Results: Results for orders placed or performed during the hospital encounter of 08/15/16 (from the past 24 hour(s))  CBC     Status: Abnormal   Collection Time: 08/19/16  5:03 AM  Result Value Ref Range   WBC 9.5 4.0 - 10.5 K/uL   RBC 2.74 (L) 4.22 - 5.81 MIL/uL   Hemoglobin 9.0 (L) 13.0 - 17.0 g/dL   HCT 27.1 (L) 39.0 - 52.0 %   MCV 98.9 78.0 - 100.0 fL   MCH 32.8 26.0 - 34.0 pg   MCHC 33.2 30.0 - 36.0 g/dL   RDW 16.7 (H) 11.5 - 15.5 %   Platelets 397 150 - 400 K/uL  Comprehensive metabolic panel     Status: Abnormal   Collection Time: 08/19/16  5:03 AM  Result Value Ref Range   Sodium 130 (L) 135 - 145 mmol/L   Potassium 3.4 (L) 3.5 - 5.1 mmol/L   Chloride 100 (L) 101 - 111 mmol/L   CO2 23 22 - 32 mmol/L   Glucose, Bld 110 (H) 65 - 99 mg/dL   BUN 13 6 - 20 mg/dL   Creatinine, Ser 1.08 0.61 - 1.24 mg/dL   Calcium 8.2 (L) 8.9 - 10.3 mg/dL   Total Protein 5.7 (L) 6.5 - 8.1 g/dL   Albumin 1.7 (L) 3.5 - 5.0 g/dL   AST 180 (H) 15 - 41 U/L   ALT 214 (H) 17 - 63 U/L   Alkaline Phosphatase 951 (H) 38 - 126 U/L   Total Bilirubin 10.6 (H) 0.3 - 1.2 mg/dL   GFR calc non Af Amer >60 >60 mL/min   GFR calc Af Amer >60 >60 mL/min   Anion gap 7 5 - 15    Studies/Results: Ir Int Lianne Cure Biliary Drain With Cholangiogram  Result Date: 08/18/2016 INDICATION: 56 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 He has been referred for PTC and drainage.  EXAM: IR CHOLANGIOGRAM PERCUTANEOUS TRANSHEPATIC MEDICATIONS: 25 mg IV Demerol ANESTHESIA/SEDATION: Moderate (conscious) sedation was employed during this procedure. A total of Versed 5.0 mg and Fentanyl 150 mcg was administered intravenously. Moderate Sedation Time: 32 minutes. The patient's level of consciousness and vital signs were monitored continuously by radiology nursing throughout the procedure under my direct supervision. FLUOROSCOPY TIME:  Fluoroscopy Time: 6.0 minutes 0 seconds (96.7 mGy). COMPLICATIONS: None PROCEDURE: The procedure, risks, benefits, and alternatives were explained to the patient and the patient's family. A complete informed consent was performed, with risk benefit analysis. Specific risks that were discussed for the procedure include bleeding, infection, biliary sepsis, IC use day, organ injury, need for further procedure, need for further surgery, long-term drain placement, cardiopulmonary collapse, death. Questions regarding the procedure were encouraged and answered. The patient understands and consents to the procedure. Patient is position in supine position on the fluoroscopy table, and the upper abdomen was prepped and draped in the usual sterile fashion. Maximum barrier sterile technique with sterile gowns and gloves were  used for the procedure. A timeout was performed prior to the initiation of the procedure. Local anesthesia was provided with 1% lidocaine with epinephrine. Ultrasound survey of the right liver lobe was performed. Window into the right biliary system was present. 1% lidocaine was used for local anesthesia, with generous infiltration of the skin and subcutaneous tissues in and inter left costal location. A Chiba needle was advanced under ultrasound guidance into the right liver lobe. Once the tip of the needle was confirmed within the biliary system by injecting small aliquots of contrast, images were stored of the biliary system after partially opacifying the  biliary tree via the needle. An 018 wire was advanced centrally. The needle was removed, a small incision was made with an 11 blade scalpel, and then a triaxial Accustick system was advanced into the biliary system. The metal stiffener and dilator were removed, we confirmed placement with contrast infusion. A coaxial Glidewire and 4 French glide cath were then used to navigate across the obstruction at the ampulla. Once the catheter was advanced to the duodenum, the wire was removed and contrast confirmed location. A Coons wire was advanced through the system, and the Accustick and Glidewire were removed. Dilation of the subcutaneous tissue tracks was performed with an 8 [place] dilator, and then a 10 [place] biliary drain was placed as an internal/external biliary drain. Small amount of contrast confirmed location. The patient tolerated the procedure well and remained hemodynamically stable throughout. No complications were encountered and no significant blood loss was encountered. IMPRESSION: Status post percutaneous transhepatic cholangiogram with placement of internal/external biliary drain across ampullary obstruction. Sample of bile sent the to lab for analysis. Signed, Dulcy Fanny. Earleen Newport, DO [facility] Radiology Electronically Signed   By: Corrie Mckusick D.O.   On: 08/18/2016 16:13      Assessment: Obstructive jaundice status post PTC with drainage by interventional radiology.  Plan:   Follow clinically. Okay to advance diet. Check biliary fluid for cytology that was obtained during the IR procedure.    SAM F Penelope Coop 08/19/2016, 9:35 AM  Pager: [phone] If no answer or after 5 PM call [phone] Lab Results  Component Value Date   HGB 9.0 (L) 08/19/2016   HGB 8.3 (L) 08/17/2016   HGB 8.8 (L) 08/15/2016   HCT 27.1 (L) 08/19/2016   HCT 25.1 (L) 08/17/2016   HCT 25.9 (L) 08/15/2016   ALKPHOS 951 (H) 08/19/2016   ALKPHOS 1,007 (H)  08/18/2016   ALKPHOS 1,048 (H) 08/17/2016   AST 180 (H) 08/19/2016   AST 221 (H) 08/18/2016   AST 243 (H) 08/17/2016   ALT 214 (H) 08/19/2016   ALT 232 (H) 08/18/2016   ALT 258 (H) 08/17/2016

## 2016-08-20 LAB — COMPREHENSIVE METABOLIC PANEL
ALK PHOS: 822 U/L — AB (ref 38–126)
ALT: 168 U/L — AB (ref 17–63)
AST: 124 U/L — ABNORMAL HIGH (ref 15–41)
Albumin: 1.8 g/dL — ABNORMAL LOW (ref 3.5–5.0)
Anion gap: 6 (ref 5–15)
BILIRUBIN TOTAL: 9.9 mg/dL — AB (ref 0.3–1.2)
BUN: 11 mg/dL (ref 6–20)
CALCIUM: 8.4 mg/dL — AB (ref 8.9–10.3)
CO2: 24 mmol/L (ref 22–32)
CREATININE: 0.78 mg/dL (ref 0.61–1.24)
Chloride: 103 mmol/L (ref 101–111)
Glucose, Bld: 90 mg/dL (ref 65–99)
Potassium: 3.8 mmol/L (ref 3.5–5.1)
Sodium: 133 mmol/L — ABNORMAL LOW (ref 135–145)
TOTAL PROTEIN: 5.8 g/dL — AB (ref 6.5–8.1)

## 2016-08-20 MED ORDER — POLYETHYLENE GLYCOL 3350 17 G PO PACK: 17.0000 g | PACK | Freq: Every day | ORAL | Status: DC | PRN

## 2016-08-20 MED ORDER — DOCUSATE SODIUM 100 MG PO CAPS
100.0000 mg | ORAL_CAPSULE | Freq: Two times a day (BID) | ORAL | Status: DC
  Administered 2016-08-20: 100 mg via ORAL
  Filled 2016-08-20 (×4): qty 1

## 2016-08-20 NOTE — Progress Notes (Signed)
[name] PROGRESS NOTE  Travis Pugh IZT:[id number] DOB: 10/23/1960 DOA: 08/15/2016 PCP: Andria Frames, MD  Interim summary and HPI 56 y.o. male with no significant PMHx, presented to the ED c/o severe jaundice first noted last night. Patient was seen his PCP, due to feeling tired, labs were drawn and patient was told to come to ED.  Patient stated that his sister noted his eyes to be yellow last night and told him to come to the ED. Patient reports itchiness is the only associated symptoms at this time, but recalls having diarrhea, nausea and vomiting about 2 weeks ago that resolved on its own. Patient also report feeling weak for the past 2 weeks. Denies abdominal pain. Denies drug use or casual sex. No recent travel.  CT Abdomen shows severe intra and extrahepatic ductal duct dilatation and gallbladder enlargement   Assessment/Plan: Common bile duct obstruction/Painless jaundice  -s/p ERCP with failure to place stent -currently no indication for surgical intervention. Will follow tissue brushes and pursuit PCT by IR. Base on results might discussed with CCS again (for dedicated surgical resection needs) -will follow LFT's (continue improving and trending down) -continue supportive care -CA-19-9 1171 -will follow biliary fluid cytology  -will continue soft diet -might need EUS for further diagnostics   AKI - due to dehydration, unknown Cr baseline  -will continue IVF's, rate adjusted -minimize/avoid nephrotoxic agents -repeated Cr back to baseline now (0.95)  Dehydration with Hyponatremia - likely from dehydration - asymptomatic  -continue IVF's -electrolytes improving appropriately -will follow CMET in am again  Anemia - unknown etiology at this time. No signs of overt bleeding  -Anemia panel suggesting anemia or chronic disease (ferritin 1600 range) -Transfuse if Hgb < 7  -CBC today with hgb 9.8 (2/10)  Code Status: Full Family Communication: no family at bedside   Disposition Plan: remains inpatient for now; S/P PCT by IR. LFT's trending down. Will advance diet and follow biliary fluid cytology.   Consultants: GI IR  Procedures: See below for x-ray reports ERCP: unable to have placement of stent Planning PCT 08/18/16  Antibiotics:  None   HPI/Subjective: Patient is afebrile, denies CP and SOB. Patient is icteric (but less today). Reports ome pain in his abd and 1 episode of vomiting overnight.  Objective: Vitals:   08/19/16 2109 08/20/16 0540  BP: (!) 141/80 (!) 136/93  Pulse: 82 86  Resp: 16 16  Temp: 98.7 F (37.1 C) 98.3 F (36.8 C)    Intake/Output Summary (Last 24 hours) at 08/20/16 1418 Last data filed at 08/20/16 1404  Gross per 24 hour  Intake             4055 ml  Output             5850 ml  Net            -1795 ml   Filed Weights   08/15/16 1703  Weight: 70.3 kg (155 lb)    Exam:   General:  Afebrile. Status post PCT by IR on 2/9; reporting improvement in pain, no nausea, no vomiting and tolerating soft diet. Jaundice still appreciated (even improving)   Cardiovascular: S1 and s2, no rubs or gallops  Respiratory: CTA bilaterally  Abdomen: soft, NT, ND, positive BS  Musculoskeletal: no edema no cyanosis or clubbing   Data Reviewed: Basic Metabolic Panel:  Recent Labs Lab 08/15/16 2222 08/17/16 0513 08/18/16 0512 08/19/16 0503 08/20/16 0421  NA 126* 129* 126* 130* 133*  K 3.6 3.8 3.4* 3.4*  3.8  CL 95* 101 98* 100* 103  CO2 21* 21* 20* 23 24  GLUCOSE 98 99 101* 110* 90  BUN 24* 15 12 13 11   CREATININE 1.36* 0.95 0.94 1.08 0.78  CALCIUM 8.8* 8.4* 8.4* 8.2* 8.4*   Liver Function Tests:  Recent Labs Lab 08/15/16 2222 08/17/16 0513 08/18/16 0512 08/19/16 0503 08/20/16 0421  AST 407* 243* 221* 180* 124*  ALT 330* 258* 232* 214* 168*  ALKPHOS 1,140* 1,048* 1,007* 951* 822*  BILITOT 24.9* 19.0* 16.7* 10.6* 9.9*  PROT 6.9 5.9* 5.6* 5.7* 5.8*  ALBUMIN 2.2* 1.8* 1.7* 1.7* 1.8*    Recent  Labs Lab 08/15/16 2222  LIPASE 117*   CBC:  Recent Labs Lab 08/15/16 2222 08/17/16 0513 08/19/16 0503  WBC 8.3 6.1 9.5  NEUTROABS  --  4.5  --   HGB 8.8* 8.3* 9.0*  HCT 25.9* 25.1* 27.1*  MCV 97.4 97.7 98.9  PLT 373 381 397    Studies: Ir Int Lianne Cure Biliary Drain With Cholangiogram  Result Date: 08/18/2016 INDICATION: 56 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 He has been referred for PTC and drainage. EXAM: IR CHOLANGIOGRAM PERCUTANEOUS TRANSHEPATIC MEDICATIONS: 25 mg IV Demerol ANESTHESIA/SEDATION: Moderate (conscious) sedation was employed during this procedure. A total of Versed 5.0 mg and Fentanyl 150 mcg was administered intravenously. Moderate Sedation Time: 32 minutes. The patient's level of consciousness and vital signs were monitored continuously by radiology nursing throughout the procedure under my direct supervision. FLUOROSCOPY TIME:  Fluoroscopy Time: 6.0 minutes 0 seconds (96.7 mGy). COMPLICATIONS: None PROCEDURE: The procedure, risks, benefits, and alternatives were explained to the patient and the patient's family. A complete informed consent was performed, with risk benefit analysis. Specific risks that were discussed for the procedure include bleeding, infection, biliary sepsis, IC use day, organ injury, need for further procedure, need for further surgery, long-term drain placement, cardiopulmonary collapse, death. Questions regarding the procedure were encouraged and answered. The patient understands and consents to the procedure. Patient is position in supine position on the fluoroscopy table, and the upper abdomen was prepped and draped in the usual sterile fashion. Maximum barrier sterile technique with sterile gowns and gloves were used for the procedure. A timeout was performed prior to the initiation of the procedure. Local anesthesia was provided with 1% lidocaine with epinephrine. Ultrasound survey of the right liver lobe was  performed. Window into the right biliary system was present. 1% lidocaine was used for local anesthesia, with generous infiltration of the skin and subcutaneous tissues in and inter left costal location. A Chiba needle was advanced under ultrasound guidance into the right liver lobe. Once the tip of the needle was confirmed within the biliary system by injecting small aliquots of contrast, images were stored of the biliary system after partially opacifying the biliary tree via the needle. An 018 wire was advanced centrally. The needle was removed, a small incision was made with an 11 blade scalpel, and then a triaxial Accustick system was advanced into the biliary system. The metal stiffener and dilator were removed, we confirmed placement with contrast infusion. A coaxial Glidewire and 4 French glide cath were then used to navigate across the obstruction at the ampulla. Once the catheter was advanced to the duodenum, the wire was removed and contrast confirmed location. A Coons wire was advanced through the system, and the Accustick and Glidewire were removed. Dilation of the subcutaneous tissue tracks was performed with an 8 [place] dilator, and then a 10 [place]  biliary drain was placed as an internal/external biliary drain. Small amount of contrast confirmed location. The patient tolerated the procedure well and remained hemodynamically stable throughout. No complications were encountered and no significant blood loss was encountered. IMPRESSION: Status post percutaneous transhepatic cholangiogram with placement of internal/external biliary drain across ampullary obstruction. Sample of bile sent the to lab for analysis. Signed, Dulcy Fanny. Earleen Newport, DO [facility] Electronically Signed   By: Corrie Mckusick D.O.   On: 08/18/2016 16:13    Scheduled Meds: . heparin  5,000 Units Subcutaneous Q8H   Continuous Infusions: . sodium chloride 100 mL/hr at 08/20/16  1106  . lactated ringers      Active Problems:   Jaundice   Bile duct obstruction   AKI (acute kidney injury) ([place])   Anemia   Elevated LFTs   Time spent: 25 minutes   Barton Dubois  [name] Pager [phone]. If 7PM-7AM, please contact night-coverage at [email or web address], password [facility] 08/20/2016, 2:18 PM  LOS: 4 days

## 2016-08-20 NOTE — Progress Notes (Signed)
[name]  Subjective: No complaints. Draining bile into bag but LFTs and bilirubin still up.  Objective: Vital signs in last 24 hours: Temp:  [98.2 F (36.8 C)-98.7 F (37.1 C)] 98.3 F (36.8 C) (02/11 0540) Pulse Rate:  [80-86] 86 (02/11 0540) Resp:  [16] 16 (02/11 0540) BP: (136-141)/(80-93) 136/93 (02/11 0540) SpO2:  [100 %] 100 % (02/11 0540) Weight change:    PE: No distress Abdomen soft  Lab Results: Results for orders placed or performed during the hospital encounter of 08/15/16 (from the past 24 hour(s))  Comprehensive metabolic panel     Status: Abnormal   Collection Time: 08/20/16  4:21 AM  Result Value Ref Range   Sodium 133 (L) 135 - 145 mmol/L   Potassium 3.8 3.5 - 5.1 mmol/L   Chloride 103 101 - 111 mmol/L   CO2 24 22 - 32 mmol/L   Glucose, Bld 90 65 - 99 mg/dL   BUN 11 6 - 20 mg/dL   Creatinine, Ser 1.470.78 0.61 - 1.24 mg/dL   Calcium 8.4 (L) 8.9 - 10.3 mg/dL   Total Protein 5.8 (L) 6.5 - 8.1 g/dL   Albumin 1.8 (L) 3.5 - 5.0 g/dL   AST 829124 (H) 15 - 41 U/L   ALT 168 (H) 17 - 63 U/L   Alkaline Phosphatase 822 (H) 38 - 126 U/L   Total Bilirubin 9.9 (H) 0.3 - 1.2 mg/dL   GFR calc non Af Amer >60 >60 mL/min   GFR calc Af Amer >60 >60 mL/min   Anion gap 6 5 - 15    Studies/Results: No results found.    Assessment: Obstructive jaundice. 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  Plan:    Would ask oncology to see Monday. May benefit from an EUS to see if Dx can be further clarified and if he would be a surgical candidate for a Whipples.    SAM F Tiffanyann Deroo 08/20/2016, 10:11 AM  Pager: [phone] If no answer or after 5 PM call [phone]

## 2016-08-21 DIAGNOSIS — E876 Hypokalemia: Secondary | ICD-10-CM

## 2016-08-21 LAB — MAGNESIUM: Magnesium: 1.6 mg/dL — ABNORMAL LOW (ref 1.7–2.4)

## 2016-08-21 LAB — COMPREHENSIVE METABOLIC PANEL
ALT: 131 U/L — AB (ref 17–63)
ANION GAP: 8 (ref 5–15)
AST: 80 U/L — ABNORMAL HIGH (ref 15–41)
Albumin: 1.8 g/dL — ABNORMAL LOW (ref 3.5–5.0)
Alkaline Phosphatase: 695 U/L — ABNORMAL HIGH (ref 38–126)
BUN: 12 mg/dL (ref 6–20)
CO2: 25 mmol/L (ref 22–32)
CREATININE: 1.08 mg/dL (ref 0.61–1.24)
Calcium: 8.2 mg/dL — ABNORMAL LOW (ref 8.9–10.3)
Chloride: 100 mmol/L — ABNORMAL LOW (ref 101–111)
GFR calc non Af Amer: >60 mL/min (ref >60)
Glucose, Bld: 86 mg/dL (ref 65–99)
Potassium: 3 mmol/L — ABNORMAL LOW (ref 3.5–5.1)
SODIUM: 133 mmol/L — AB (ref 135–145)
Total Bilirubin: 9 mg/dL — ABNORMAL HIGH (ref 0.3–1.2)
Total Protein: 6.1 g/dL — ABNORMAL LOW (ref 6.5–8.1)

## 2016-08-21 MED ORDER — POTASSIUM CHLORIDE CRYS ER 20 MEQ PO TBCR
40.0000 meq | EXTENDED_RELEASE_TABLET | ORAL | Status: AC
  Administered 2016-08-21 (×3): 40 meq via ORAL
  Filled 2016-08-21 (×3): qty 2

## 2016-08-21 MED ORDER — POLYETHYLENE GLYCOL 3350 17 G PO PACK
17.0000 g | PACK | Freq: Every day | ORAL | Status: DC
  Administered 2016-08-21 – 2016-08-22 (×2): 17 g via ORAL
  Filled 2016-08-21 (×2): qty 1

## 2016-08-21 MED ORDER — MAGNESIUM SULFATE 2 GM/50ML IV SOLN
2.0000 g | Freq: Once | INTRAVENOUS | Status: AC
  Administered 2016-08-21: 2 g via INTRAVENOUS
  Filled 2016-08-21: qty 50

## 2016-08-21 NOTE — Progress Notes (Signed)
Referring Physician(s): Hayes,J  Supervising Physician: Markus Daft  Patient Status:  [facility] - In-pt  Chief Complaint:  Fatigue, nausea, and vomiting  Subjective:  Travis Pugh is doing well. He has no acute complaints at this time and is feeling better than yesterday. He has drained 1.9 L of turbid, green fluid/bile since yesterday. He denies fever, chills, N/V, and abdominal pain.  Allergies: Patient has no known allergies.  Medications: Prior to Admission medications   Medication Sig Start Date End Date Taking? Authorizing Provider  aspirin-sod bicarb-citric acid (ALKA-SELTZER) 325 MG TBEF tablet Take 325 mg by mouth every 6 (six) hours as needed.   Yes Historical Provider, MD  Multiple Vitamins-Minerals (EMERGEN-C VITAMIN C PO) Take 1 packet by mouth daily.   Yes Historical Provider, MD  Phenylephrine-DM (THERAFLU COLD/COUGH DAYTIME PO) Take 1 capsule by mouth daily.   Yes Historical Provider, MD     Vital Signs: BP (!) 143/88 (BP Location: Left Arm)   Pulse 91   Temp 98.4 F (36.9 C) (Oral)   Resp 18   Ht 5' 9"  (1.753 m)   Wt 155 lb (70.3 kg)   SpO2 100%   BMI 22.89 kg/m   Physical Exam Drain site clean, dry, and intact with no signs of infection or leakage. Imaging: Ir Int Lianne Cure Biliary Drain With Cholangiogram  Result Date: 08/18/2016 INDICATION: 56 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 He has been referred for PTC and drainage. EXAM: IR CHOLANGIOGRAM PERCUTANEOUS TRANSHEPATIC MEDICATIONS: 25 mg IV Demerol ANESTHESIA/SEDATION: Moderate (conscious) sedation was employed during this procedure. A total of Versed 5.0 mg and Fentanyl 150 mcg was administered intravenously. Moderate Sedation Time: 32 minutes. The patient's level of consciousness and vital signs were monitored continuously by radiology nursing throughout the procedure under my direct supervision. FLUOROSCOPY TIME:  Fluoroscopy Time: 6.0 minutes 0 seconds (96.7 mGy).  COMPLICATIONS: None PROCEDURE: The procedure, risks, benefits, and alternatives were explained to the patient and the patient's family. A complete informed consent was performed, with risk benefit analysis. Specific risks that were discussed for the procedure include bleeding, infection, biliary sepsis, IC use day, organ injury, need for further procedure, need for further surgery, long-term drain placement, cardiopulmonary collapse, death. Questions regarding the procedure were encouraged and answered. The patient understands and consents to the procedure. Patient is position in supine position on the fluoroscopy table, and the upper abdomen was prepped and draped in the usual sterile fashion. Maximum barrier sterile technique with sterile gowns and gloves were used for the procedure. A timeout was performed prior to the initiation of the procedure. Local anesthesia was provided with 1% lidocaine with epinephrine. Ultrasound survey of the right liver lobe was performed. Window into the right biliary system was present. 1% lidocaine was used for local anesthesia, with generous infiltration of the skin and subcutaneous tissues in and inter left costal location. A Chiba needle was advanced under ultrasound guidance into the right liver lobe. Once the tip of the needle was confirmed within the biliary system by injecting small aliquots of contrast, images were stored of the biliary system after partially opacifying the biliary tree via the needle. An 018 wire was advanced centrally. The needle was removed, a small incision was made with an 11 blade scalpel, and then a triaxial Accustick system was advanced into the biliary system. The metal stiffener and dilator were removed, we confirmed placement with contrast infusion. A coaxial Glidewire and 4 French glide cath were then used to  navigate across the obstruction at the ampulla. Once the catheter was advanced to the duodenum, the wire was removed and contrast  confirmed location. A Coons wire was advanced through the system, and the Accustick and Glidewire were removed. Dilation of the subcutaneous tissue tracks was performed with an 8 [place] dilator, and then a 10 [place] biliary drain was placed as an internal/external biliary drain. Small amount of contrast confirmed location. The patient tolerated the procedure well and remained hemodynamically stable throughout. No complications were encountered and no significant blood loss was encountered. IMPRESSION: Status post percutaneous transhepatic cholangiogram with placement of internal/external biliary drain across ampullary obstruction. Sample of bile sent the to lab for analysis. Signed, Dulcy Fanny. Earleen Newport, DO [facility] Electronically Signed   By: Corrie Mckusick D.O.   On: 08/18/2016 16:13    Labs:  CBC:  Recent Labs  08/15/16 2222 08/17/16 0513 08/19/16 0503  WBC 8.3 6.1 9.5  HGB 8.8* 8.3* 9.0*  HCT 25.9* 25.1* 27.1*  PLT 373 381 397    COAGS:  Recent Labs  08/15/16 2222  INR 1.23  APTT 35    BMP:  Recent Labs  08/18/16 0512 08/19/16 0503 08/20/16 0421 08/21/16 0442  NA 126* 130* 133* 133*  K 3.4* 3.4* 3.8 3.0*  CL 98* 100* 103 100*  CO2 20* 23 24 25   GLUCOSE 101* 110* 90 86  BUN 12 13 11 12   CALCIUM 8.4* 8.2* 8.4* 8.2*  CREATININE 0.94 1.08 0.78 1.08  GFRNONAA >60 >60 >60 >60  GFRAA >60 >60 >60 >60    LIVER FUNCTION TESTS:  Recent Labs  08/18/16 0512 08/19/16 0503 08/20/16 0421 08/21/16 0442  BILITOT 16.7* 10.6* 9.9* 9.0*  AST 221* 180* 124* 80*  ALT 232* 214* 168* 131*  ALKPHOS 1,007* 951* 822* 695*  PROT 5.6* 5.7* 5.8* 6.1*  ALBUMIN 1.7* 1.7* 1.8* 1.8*    Assessment and Plan:  Travis Pugh is doing well. He has no acute complaints at this time and is feeling better than yesterday. He has drained 500 cc if turbid, green fluid since yesterday. He denies fever, chills, N/V, and abdominal  pain.  Obstructive jaundice S/p PTC with I/E biliary drain 2/9 T Bili down to 9.0. Cultures pending Cont to gravity drain Send sample of biliary fluid to cytology IR following   Electronically Signed: D. Alden Server, PA-S 08/21/2016, 1:25 PM   I spent a total of 20 at the the patient's bedside AND on the patient's hospital floor or unit, greater than 50% of which was counseling/coordinating care for percutaneous biliary drain.    Patient ID: Travis Pugh, male   DOB: 09-Oct-1960, 56 y.o.   MRN: [id number]

## 2016-08-21 NOTE — Progress Notes (Signed)
Spoke with patient and family at bedside. Discussed d/c planning with patient. He lives alone, lives in a boarding house, has multiple steps to enter. Patient has a drain in place requiring flushing and dressing changes. Paient does not have a teachable caregiver to assist. [place] in [place], not other family or friends around to assist. Patient is agreeable for SNF if approved. Contacted CSW to make her aware, awaiting final PT recommendations. Plan for d/c to SNF, discharge planning per CSW. [phone]

## 2016-08-21 NOTE — Progress Notes (Signed)
[name] Subjective patient is doing well. He had percutaneous strain in his total bilirubin is down to 9.0. He is eating and doing reasonably well. He has been up to the bathroom. Discussed with he and his family. He does live alone will not have any assistance in caring for his percutaneous strain. He's had no fever chills etc.  Objective: Vital signs in last 24 hours: Temp:  [98.4 F (36.9 C)-100 F (37.8 C)] 100 F (37.8 C) (02/12 1455) Pulse Rate:  [87-98] 93 (02/12 1455) Resp:  [16-18] 16 (02/12 1455) BP: (137-152)/(86-93) 137/86 (02/12 1455) SpO2:  [100 %] 100 % (02/12 1455) Last BM Date: 08/18/16  Intake/Output from previous day: 02/11 0701 - 02/12 0700 In: 2670 [P.O.:220; I.V.:2445] Out: 5450 [Urine:1500; Drains:3950] Intake/Output this shift: Total I/O In: 1590 [P.O.:1590] Out: 2755 [Urine:600; Drains:2155]  PE: General-- alert and oriented no distress  Abdomen-- nondistended soft and nontender.  Lab Results:  Recent Labs  08/19/16 0503  WBC 9.5  HGB 9.0*  HCT 27.1*  PLT 397   BMET  Recent Labs  08/19/16 0503 08/20/16 0421 08/21/16 0442  NA 130* 133* 133*  K 3.4* 3.8 3.0*  CL 100* 103 100*  CO2 23 24 25   CREATININE 1.08 0.78 1.08   LFT  Recent Labs  08/19/16 0503 08/20/16 0421 08/21/16 0442  PROT 5.7* 5.8* 6.1*  AST 180* 124* 80*  ALT 214* 168* 131*  ALKPHOS 951* 822* 695*  BILITOT 10.6* 9.9* 9.0*   PT/INR No results for input(s): LABPROT, INR in the last 72 hours. PANCREAS No results for input(s): LIPASE in the last 72 hours.       Studies/Results: No results found.  Medications: I have reviewed the patient's current medications.  Assessment/Plan: 1. 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 Cytology is currently pending. The patient has abrupt obstruction of the bile duct at 10 mm from the ampulla with no clear lesion seen on MRI no pancreatic duct dilatation. ERCP was unsuccessful.  He has a percutaneous strain. CA 19 - 9 is over thousand consistent with the pancreatic tumor. Will review films with radiologists. He will likely need endoscopic ultrasound with biopsy and at some point internalization of the drain if possible.   Rahmir Beever JR,Praise Stennett L 08/21/2016, 3:16 PM  This note was created using voice recognition software. Minor errors may Have occurred unintentionally.  Pager: [phone] If no answer or after hours call [phone]

## 2016-08-21 NOTE — Progress Notes (Signed)
[name] PROGRESS NOTE  Travis AsalRonald Pugh UJW:[id number] DOB: 01/29/1961 DOA: 08/15/2016 PCP: [name] RilyJONES,ENRICO G, MD  Interim summary and HPI 56 y.o. male with no significant PMHx, presented to the ED c/o severe jaundice first noted last night. Patient was seen his PCP, due to feeling tired, labs were drawn and patient was told to come to ED.  Patient stated that his sister noted his eyes to be yellow last night and told him to come to the ED. Patient reports itchiness is the only associated symptoms at this time, but recalls having diarrhea, nausea and vomiting about 2 weeks ago that resolved on its own. Patient also report feeling weak for the past 2 weeks. Denies abdominal pain. Denies drug use or casual sex. No recent travel.  CT Abdomen shows severe intra and extrahepatic ductal duct dilatation and gallbladder enlargement   Assessment/Plan: Common bile duct obstruction/Painless jaundice  -s/p ERCP with failure to place stent -currently no indication for surgical intervention. Will follow tissue brushes and pursuit PCT by IR. Base on results might discussed with CCS again (for dedicated surgical resection needs, like potential whipple) -will follow LFT's (which continue improving and trending down) -continue supportive care -CA-19-9 1171 -will follow biliary fluid cytology  -will continue soft diet -might need EUS for further diagnostics   AKI - due to dehydration, unknown Cr baseline  -will continue IVF's, rate adjusted -minimize/avoid nephrotoxic agents -repeated Cr back to baseline last (0.95); will monitor intermittently  Dehydration with Hyponatremia - likely from dehydration - asymptomatic  -continue IVF's -electrolytes improving appropriately -will follow electrolytes intermittently  Anemia - unknown etiology at this time. No signs of overt bleeding  -Anemia panel suggesting anemia or chronic disease (ferritin 1600 range) -Transfuse if Hgb < 7  -CBC today with hgb 9.0  (2/10)  Code Status: Full Family Communication: no family at bedside  Disposition Plan: remains inpatient for now; S/P PCT by IR. LFT's trending down. Will advance diet and follow biliary fluid cytology.   Consultants: GI IR  Procedures: See below for x-ray reports ERCP: unable to have placement of stent S/P PCT 08/18/16  Antibiotics:  None   HPI/Subjective: Patient is afebrile, denies CP and SOB. Patient less icteric. Reports no abd pain. Patient reports being weak and tired.  Objective: Vitals:   08/21/16 0500 08/21/16 1455  BP: (!) 143/88 137/86  Pulse: 91 93  Resp: 18 16  Temp: 98.4 F (36.9 C) 100 F (37.8 C)    Intake/Output Summary (Last 24 hours) at 08/21/16 1610 Last data filed at 08/21/16 1458  Gross per 24 hour  Intake             4135 ml  Output             6455 ml  Net            -2320 ml   Filed Weights   08/15/16 1703  Weight: 70.3 kg (155 lb)    Exam:   General:  Afebrile. Status post PCT by IR on 2/9; reporting no abd pain, no nausea and no vomiting. Jaundice still appreciated (even much improved)   Cardiovascular: S1 and s2, no rubs or gallops  Respiratory: CTA bilaterally  Abdomen: soft, NT, ND, positive BS  Musculoskeletal: no edema no cyanosis or clubbing   Data Reviewed: Basic Metabolic Panel:  Recent Labs Lab 08/17/16 0513 08/18/16 0512 08/19/16 0503 08/20/16 0421 08/21/16 0442  NA 129* 126* 130* 133* 133*  K 3.8 3.4* 3.4* 3.8 3.0*  CL  101 98* 100* 103 100*  CO2 21* 20* 23 24 25   GLUCOSE 99 101* 110* 90 86  BUN 15 12 13 11 12   CREATININE 0.95 0.94 1.08 0.78 1.08  CALCIUM 8.4* 8.4* 8.2* 8.4* 8.2*  MG  --   --   --   --  1.6*   Liver Function Tests:  Recent Labs Lab 08/17/16 0513 08/18/16 0512 08/19/16 0503 08/20/16 0421 08/21/16 0442  AST 243* 221* 180* 124* 80*  ALT 258* 232* 214* 168* 131*  ALKPHOS 1,048* 1,007* 951* 822* 695*  BILITOT 19.0* 16.7* 10.6* 9.9* 9.0*  PROT 5.9* 5.6* 5.7* 5.8* 6.1*  ALBUMIN  1.8* 1.7* 1.7* 1.8* 1.8*    Recent Labs Lab 08/15/16 2222  LIPASE 117*   CBC:  Recent Labs Lab 08/15/16 2222 08/17/16 0513 08/19/16 0503  WBC 8.3 6.1 9.5  NEUTROABS  --  4.5  --   HGB 8.8* 8.3* 9.0*  HCT 25.9* 25.1* 27.1*  MCV 97.4 97.7 98.9  PLT 373 381 397    Studies: No results found.  Scheduled Meds: . docusate sodium  100 mg Oral BID  . heparin  5,000 Units Subcutaneous Q8H  . potassium chloride  40 mEq Oral Q4H   Continuous Infusions: . sodium chloride 100 mL/hr at 08/21/16 0708  . lactated ringers      Active Problems:   Jaundice   Bile duct obstruction   AKI (acute kidney injury) ([facility])   Anemia   Elevated LFTs   Time spent: 25 minutes   Vassie Loll  [name] Pager [phone]. If 7PM-7AM, please contact night-coverage at [email or web address], password [facility] 08/21/2016, 4:10 PM  LOS: 5 days

## 2016-08-21 NOTE — Care Management Important Message (Signed)
Important Message  Patient Details  Name: Travis Pugh Goughnour MRN: [id number] Date of Birth: 06-18-1961   Medicare Important Message Given:  Yes    Caren MacadamFuller, Deloros Beretta 08/21/2016, 11:44 AMImportant Message  Patient Details  Name: Travis Pugh Cheese MRN: [id number] Date of Birth: 06-18-1961   Medicare Important Message Given:  Yes    Caren MacadamFuller, Laquanda Bick 08/21/2016, 11:44 AM

## 2016-08-22 DIAGNOSIS — E876 Hypokalemia: Secondary | ICD-10-CM

## 2016-08-22 LAB — COMPREHENSIVE METABOLIC PANEL
ALBUMIN: 1.8 g/dL — AB (ref 3.5–5.0)
ALT: 105 U/L — ABNORMAL HIGH (ref 17–63)
ANION GAP: 8 (ref 5–15)
AST: 69 U/L — ABNORMAL HIGH (ref 15–41)
Alkaline Phosphatase: 561 U/L — ABNORMAL HIGH (ref 38–126)
BILIRUBIN TOTAL: 8 mg/dL — AB (ref 0.3–1.2)
BUN: 15 mg/dL (ref 6–20)
CHLORIDE: 99 mmol/L — AB (ref 101–111)
CO2: 25 mmol/L (ref 22–32)
Calcium: 8.2 mg/dL — ABNORMAL LOW (ref 8.9–10.3)
Creatinine, Ser: 1.24 mg/dL (ref 0.61–1.24)
GFR calc Af Amer: >60 mL/min (ref >60)
GFR calc non Af Amer: >60 mL/min (ref >60)
GLUCOSE: 108 mg/dL — AB (ref 65–99)
POTASSIUM: 3.2 mmol/L — AB (ref 3.5–5.1)
SODIUM: 132 mmol/L — AB (ref 135–145)
TOTAL PROTEIN: 6.3 g/dL — AB (ref 6.5–8.1)

## 2016-08-22 MED ORDER — DOCUSATE SODIUM 100 MG PO CAPS: 100.0000 mg | ORAL_CAPSULE | Freq: Two times a day (BID) | ORAL | Status: AC

## 2016-08-22 MED ORDER — ACETAMINOPHEN 325 MG PO TABS: 650.0000 mg | ORAL_TABLET | Freq: Four times a day (QID) | ORAL | Status: DC | PRN

## 2016-08-22 MED ORDER — POLYETHYLENE GLYCOL 3350 17 G PO PACK: 17.0000 g | PACK | Freq: Every day | ORAL | Status: AC

## 2016-08-22 NOTE — Progress Notes (Signed)
Rn attempted to [facility]. No one avaliable to answer the phone. Will attempt report later on.  Stephane Junkins W [place], RN

## 2016-08-22 NOTE — Evaluation (Signed)
Physical Therapy Evaluation Patient Details Name: Travis Pugh MRN: [id number] DOB: Jan 09, 1961 Today's Date: 08/22/2016   History of Present Illness  56 yo male admitted with jaundice. S/P placement R biliary drain 08/18/16.   Clinical Impression  On eval, pt was Min guard-Min assist for mobility. He walked ~150 feet in hallway without an assistive device. No LOB. Moderate pain with activity. Anticipate pt will progress well with respect to his mobility. Pt lives alone and he reports he has numerous steps (30 or more) to get into his home/apt. He states he will also likely require assistance for managing his drain. Will follow and progress activity as tolerated.     Follow Up Recommendations SNF vs No PT follow up (depending on progress. Possible d/c to SNF for medical reasons)    Equipment Recommendations  None recommended by PT    Recommendations for Other Services       Precautions / Restrictions Precautions Precautions: Fall Precaution Comments: R side drain Restrictions Weight Bearing Restrictions: No      Mobility  Bed Mobility Overal bed mobility: Needs Assistance Bed Mobility: Supine to Sit;Sit to Supine     Supine to sit: HOB elevated;Min guard Sit to supine: Min assist   General bed mobility comments: Increased time. Small amount of assist for R LE onto bed due to pain. No assist to go from supine to sit to get to EOB.   Transfers Overall transfer level: Needs assistance   Transfers: Sit to/from Stand Sit to Stand: Min guard         General transfer comment: close guard for safety.   Ambulation/Gait Ambulation/Gait assistance: Min guard Ambulation Distance (Feet): 150 Feet Assistive device: None Gait Pattern/deviations: Step-through pattern;Decreased stride length     General Gait Details: close guard for safety. Pt tolerated distance fairly well. Pt c/o some pain.   Stairs            Wheelchair Mobility    Modified Rankin (Stroke Patients  Only)       Balance                                             Pertinent Vitals/Pain Pain Assessment: Faces Faces Pain Scale: Hurts even more Pain Location: R side abdomen with activity Pain Descriptors / Indicators: Guarding;Grimacing Pain Intervention(s): Limited activity within patient's tolerance;Repositioned    Home Living Family/patient expects to be discharged to:: Unsure Living Arrangements: Alone   Type of Home: House Home Access: Stairs to enter   [employer] of Steps: " 30 steps + 25 more" Home Layout: One level Home Equipment: None      Prior Function Level of Independence: Independent               Hand Dominance        Extremity/Trunk Assessment   Upper Extremity Assessment Upper Extremity Assessment: Overall WFL for tasks assessed    Lower Extremity Assessment Lower Extremity Assessment: Generalized weakness    Cervical / Trunk Assessment Cervical / Trunk Assessment: Normal  Communication   Communication: No difficulties  Cognition Arousal/Alertness: Awake/alert Behavior During Therapy: WFL for tasks assessed/performed Overall Cognitive Status: Within Functional Limits for tasks assessed                      General Comments      Exercises     Assessment/Plan  PT Assessment Patient needs continued PT services  PT Problem List Decreased mobility;Decreased activity tolerance;Pain          PT Treatment Interventions Gait training;Therapeutic activities;Therapeutic exercise;Patient/family education;Functional mobility training    PT Goals (Current goals can be found in the Care Plan section)  Acute Rehab PT Goals Patient Stated Goal: regain independence PT Goal Formulation: With patient Time For Goal Achievement: 09/05/16 Potential to Achieve Goals: Good    Frequency Min 3X/week   Barriers to discharge        Co-evaluation               End of Session   Activity  Tolerance: Patient tolerated treatment well Patient left: in bed;with call bell/phone within reach           Time: [id number] PT Time Calculation (min) (ACUTE ONLY): 15 min   Charges:   PT Evaluation $PT Eval Low Complexity: 1 Procedure     PT G Codes:        Rebeca AlertJannie Ortha Metts, MPT Pager: [phone]

## 2016-08-22 NOTE — Discharge Summary (Signed)
Physician Discharge Summary  Travis Pugh VOZ:[id number] DOB: 03/26/1961 DOA: 08/15/2016  PCP: Andria Frames, MD  Admit date: 08/15/2016 Discharge date: 08/22/2016  Time spent: 35 minutes  Recommendations for Outpatient Follow-up:  1. Repeat CMET in 1 week to follow electrolytes, renal function and LFT's 2. -repeat CBC to follow Hgb trend    Discharge Diagnoses:  Active Problems:   Jaundice   Common bile duct (CBD) obstruction   AKI (acute kidney injury) ([place])   Anemia   Elevated LFTs   Hypokalemia   Discharge Condition: stable and improved. Discharge to SNF for rehabilitation and further medical care (especially care of PCT drain)  Diet recommendation: oft diet (low fat)  Filed Weights   08/15/16 1703  Weight: 70.3 kg (155 lb)    History of present illness:  56 y.o.malewith no significant PMHx, presented to the ED c/o severe jaundice first noted last night. Patient was seen his PCP, due to feeling tired, labs were drawn and patient was told to come to ED.  Patient stated that his sister noted his eyes to be yellow last night and told him to come to the ED. Patient reports itchiness is the only associated symptoms at this time, but recalls having diarrhea, nausea and vomiting about 2 weeks ago that resolved on its own. Patient also report feeling weak for the past 2 weeks. Denies abdominal pain. Denies drug use or casual sex. No recent travel.  CT Abdomen shows severe intra and extrahepatic ductal duct dilatation and gallbladder enlargement   Hospital Course:  Common bile duct obstruction/Painless jaundice  -s/p ERCP with failure to place stent -Base on results from cytology might discussed with [facility] again (for dedicated surgical resection needs, like potential whipple) -will follow LFT's in 1 week now (continue improving day by day for 5 consecutives days) -continue supportive care -[id number] -will follow biliary fluid cytology (pending at discharge) -will continue  soft diet (low fat) -might need EUS for further diagnostics   AKI -due to dehydration, unknown Cr baseline  -minimize/avoid nephrotoxic agents -resolved with fluid resuscitation  -repeated Cr back to baseline last (0.95-1.2) -will recommend CMET to follow renal function trend   Dehydration with Hyponatremia- likely from dehydration - asymptomatic  -resolved with IVF's -will recommend CMET to follow electrolytes level  Anemia - unknown etiology at this time. No signs of overt bleeding  -Anemia panel suggesting anemia or chronic disease (ferritin 1600 range) -will recommend Transfusion if Hgb < 7 -last CBC with hgb 9.0 (2/10) -remained stable during hospitalization -will benefit of colonoscopy as an outpatient for screening    Physical deconditioning  -per PT rec's will discharge to SNF  Procedures: See below for x-ray reports ERCP: unable to have placement of stent S/P PCT 08/18/16  Consultations:  Gastroenterology   IR  Discharge Exam: Vitals:   08/22/16 0518 08/22/16 1417  BP: 130/88 (!) 125/91  Pulse: 81 93  Resp: 16 16  Temp: 98.6 F (37 C) 98.7 F (37.1 C)    General:  Afebrile. Status post PCT by IR on 2/9; reporting no abd pain, no nausea and no vomiting. Jaundice still appreciated (even much improved)   Cardiovascular: S1 and s2, no rubs or gallops  Respiratory: CTA bilaterally  Abdomen: soft, NT, ND, positive BS  Musculoskeletal: no edema no cyanosis or clubbing    Discharge Instructions   Discharge Instructions    Discharge instructions    Complete by:  As directed    Low fat diet Take medications as prescribed  Follow up with GI service as instructed (with Dr. Paulita Fujita on 2/19/18at 1:00 pm) Keep yourself well hydrated Drain care and flushing instructions as per IR recommendations Repeat CMET in 1 week to follow LFT's, electrolytes and renal function     Current Discharge Medication List    START taking these medications   Details   acetaminophen (TYLENOL) 325 MG tablet Take 2 tablets (650 mg total) by mouth every 6 (six) hours as needed for mild pain or headache (or Fever >/= 101).    docusate sodium (COLACE) 100 MG capsule Take 1 capsule (100 mg total) by mouth 2 (two) times daily. Hold for diarrhea    polyethylene glycol (MIRALAX / GLYCOLAX) packet Take 17 g by mouth daily.      CONTINUE these medications which have NOT CHANGED   Details  Multiple Vitamins-Minerals (EMERGEN-C VITAMIN C PO) Take 1 packet by mouth daily.      STOP taking these medications     aspirin-sod bicarb-citric acid (ALKA-SELTZER) 325 MG TBEF tablet      Phenylephrine-DM (THERAFLU COLD/COUGH DAYTIME PO)        No Known Allergies Follow-up Information    Andria Frames, MD. Schedule an appointment as soon as possible for a visit in 2 week(s).   Specialty:  Family Medicine Contact information: [place] [address] [phone]        Landry Dyke, MD Follow up on 08/28/2016.   Specialty:  Gastroenterology Why:  at 1:00 pm Contact information: [address] [place] [address] [phone]           The results of significant diagnostics from this hospitalization (including imaging, microbiology, ancillary and laboratory) are listed below for reference.    Significant Diagnostic Studies: Dg Chest 2 View  Result Date: 08/15/2016 CLINICAL DATA:  Cough and shortness of breath. Diarrhea. Dry and itchy skin. Symptoms began last month. Patient seen at urgent care and sent here for jaundice. Previous smoker. EXAM: CHEST  2 VIEW COMPARISON:  None. FINDINGS: The heart size and mediastinal contours are within normal limits. Both lungs are clear. The visualized skeletal structures are unremarkable. IMPRESSION: No active cardiopulmonary disease. Electronically Signed   By: Lucienne Capers M.D.   On: 08/15/2016 23:03   Dg Abd 1 View  Result Date: 08/17/2016 CLINICAL DATA:  Unsuccessful ERCP, unable  to cannulate CBD EXAM: ABDOMEN - 1 VIEW COMPARISON:  MR abdomen 08/16/2016 FLUOROSCOPY TIME:  1 minutes 25 seconds Radiation dose:  25.6 mGy FINDINGS: Submitted images demonstrate failure of passage of a wire or catheter into the CBD. No contrast was injected. Visualized structures unremarkable. IMPRESSION: Failed ERCP. Electronically Signed   By: Lavonia Dana M.D.   On: 08/17/2016 13:00   US Abdomen Complete  Result Date: 08/16/2016 CLINICAL DATA:  Abnormal liver function studies.  Jaundice. EXAM: ABDOMEN ULTRASOUND COMPLETE COMPARISON:  None. FINDINGS: Gallbladder: Gallbladder is mildly distended and is diffusely sludge filled. No focal stones identified. No gallbladder wall thickening or edema. Murphy's sign is positive. Common bile duct: Diameter: 16 mm. Intra and extrahepatic bile ducts are dilated. No stone or obstructing lesion is identified but the distal common bile duct is not visualized due to bowel gas. Liver: Diffusely increased hepatic parenchymal echotexture suggesting fatty infiltration. No focal lesions identified. IVC: No abnormality visualized. Pancreas: Visualized portion unremarkable. Spleen: Size and appearance within normal limits. Right Kidney: Length: 12.7 cm. Echogenicity within normal limits. No mass or hydronephrosis visualized. Left Kidney: Length: 11.5 cm. Echogenicity within  normal limits. No mass or hydronephrosis visualized. Abdominal aorta: No aneurysm visualized. Other findings: None. IMPRESSION: Gallbladder is distended and diffusely sludge filled with positive Murphy's sign. No stones are identified but changes could indicate cholecystitis. Intra and extrahepatic bile duct dilatation is present. No cause is identified but the distal common bile duct is not visualized. Electronically Signed   By: Lucienne Capers M.D.   On: 08/16/2016 00:40   Ct Abdomen Pelvis W Contrast  Result Date: 08/16/2016 CLINICAL DATA:  56 y/o M; elevated lipase and liver enzymes. Jaundice. EXAM: CT  ABDOMEN AND PELVIS WITH CONTRAST TECHNIQUE: Multidetector CT imaging of the abdomen and pelvis was performed using the standard protocol following bolus administration of intravenous contrast. CONTRAST:  100 cc Isovue-300. COMPARISON:  08/15/2016 abdominal ultrasound. FINDINGS: Lower chest: Mild centrilobular and paraseptal emphysematous changes. Hepatobiliary: Severe intra and extrahepatic biliary ductal dilatation as well as marked enlargement of the gallbladder. Ductal dilatation sharply tapers within the pancreatic head and there is question of a mass near the sphincter of Oddi measuring 15 mm (series 2, image 35). Otherwise there is no focal abnormality of the liver identified. Pancreas: No main duct dilatation of the pancreas or appreciable inflammatory change. Spleen: Normal in size without focal abnormality. Adrenals/Urinary Tract: Adrenal glands are unremarkable. Kidneys are normal, without renal calculi, focal lesion, or hydronephrosis. Bladder is unremarkable. Stomach/Bowel: Stomach is within normal limits. Appendix appears normal. No evidence of bowel wall thickening, distention, or inflammatory changes. Vascular/Lymphatic: Aortic atherosclerosis with mild calcification, no aneurysm. No enlarged abdominal or pelvic lymph nodes. Reproductive: Central prostatic calcifications. Other: No abdominal wall hernia or abnormality. No abdominopelvic ascites. Musculoskeletal: No acute or significant osseous findings. Transitional L5 vertebral body with articulation of transverse processes to the sacral ala and mild lumbar levocurvature. IMPRESSION: Severe intra and extrahepatic biliary ductal dilatation and gallbladder enlargement with sharp tapering to occlusion of the common bile duct within the pancreatic head. No obstructing radiopaque gallstone identified. Possible mass near the sphincter of Oddi. No pancreatic ductal dilatation. MRI/MRCP of the abdomen with and without contrast is recommended. Electronically  Signed   By: Kristine Garbe M.D.   On: 08/16/2016 04:48   Mr 3d Recon At Scanner  Result Date: 08/16/2016 CLINICAL DATA:  Elevated lipase and liver enzymes. Elevated bilirubin. Jaundice. Severe biliary obstruction on comparison CT. EXAM: MRI ABDOMEN WITHOUT AND WITH CONTRAST (INCLUDING MRCP) TECHNIQUE: Multiplanar multisequence MR imaging of the abdomen was performed both before and after the administration of intravenous contrast. Heavily T2-weighted images of the biliary and pancreatic ducts were obtained, and three-dimensional MRCP images were rendered by post processing. CONTRAST:  89m MULTIHANCE GADOBENATE DIMEGLUMINE 529 MG/ML IV SOLN COMPARISON:  CT 08/16/2016 FINDINGS: Lower chest:  Lung bases are clear. Hepatobiliary: Severe intrahepatic biliary duct dilatation. No focal hepatic lesion identified. No hepatic steatosis. Gallbladder is distended to 5 cm. No gallstones identified. The common hepatic duct and the combined bile duct are severe dilated with the common hepatic duct measuring 16 mm and the common bile duct measuring 13 mm. There is abrupt narrowing of the distal common bile duct at the level of the pancreatic head. This is approximately 10 mm from the ampulla. No enhancing lesion evident within the pancreatic head. No filling defect within the common bile duct. Pancreas: The pancreatic duct is normal caliber. Potential pancreas divisum variant anatomy. The no enhancing lesion to associated with the abrupt tapering of the common bile duct through the pancreatic head. Spleen: Normal spleen. Adrenals/urinary tract: Adrenal glands and  kidneys are normal. Stomach/Bowel: Stomach and limited of the small bowel is unremarkable Vascular/Lymphatic: Abdominal aortic normal caliber. No retroperitoneal periportal lymphadenopathy. Musculoskeletal: No aggressive osseous lesion IMPRESSION: 1. Severe intrahepatic and extrahepatic biliary duct dilatation to the level of the pancreatic head with abrupt  narrowing of the common bile duct approximately 10 mm from the ampulla. No obstructing lesion identified in the pancreatic head. No pancreatic duct dilatation. No choledocholithiasis or cholelithiasis. Presumably occult obstructing lesion in the pancreatic head versus ampulla. Recommend ERCP for relief of obstruction and potential tissue sampling. 2. No pancreatic duct dilatation or inflammation. Potential pancreas divisum variant ductal anatomy. 3. Distended gallbladder.  No evidence cholelithiasis. Electronically Signed   By: Suzy Bouchard M.D.   On: 08/16/2016 12:14   Dg C-arm 61-120 Min-no Report  Result Date: 08/17/2016 Fluoroscopy was utilized by the requesting physician.  No radiographic interpretation.   Mr Abdomen Mrcp Moise Boring Contast  Result Date: 08/16/2016 CLINICAL DATA:  Elevated lipase and liver enzymes. Elevated bilirubin. Jaundice. Severe biliary obstruction on comparison CT. EXAM: MRI ABDOMEN WITHOUT AND WITH CONTRAST (INCLUDING MRCP) TECHNIQUE: Multiplanar multisequence MR imaging of the abdomen was performed both before and after the administration of intravenous contrast. Heavily T2-weighted images of the biliary and pancreatic ducts were obtained, and three-dimensional MRCP images were rendered by post processing. CONTRAST:  29m MULTIHANCE GADOBENATE DIMEGLUMINE 529 MG/ML IV SOLN COMPARISON:  CT 08/16/2016 FINDINGS: Lower chest:  Lung bases are clear. Hepatobiliary: Severe intrahepatic biliary duct dilatation. No focal hepatic lesion identified. No hepatic steatosis. Gallbladder is distended to 5 cm. No gallstones identified. The common hepatic duct and the combined bile duct are severe dilated with the common hepatic duct measuring 16 mm and the common bile duct measuring 13 mm. There is abrupt narrowing of the distal common bile duct at the level of the pancreatic head. This is approximately 10 mm from the ampulla. No enhancing lesion evident within the pancreatic head. No filling  defect within the common bile duct. Pancreas: The pancreatic duct is normal caliber. Potential pancreas divisum variant anatomy. The no enhancing lesion to associated with the abrupt tapering of the common bile duct through the pancreatic head. Spleen: Normal spleen. Adrenals/urinary tract: Adrenal glands and kidneys are normal. Stomach/Bowel: Stomach and limited of the small bowel is unremarkable Vascular/Lymphatic: Abdominal aortic normal caliber. No retroperitoneal periportal lymphadenopathy. Musculoskeletal: No aggressive osseous lesion IMPRESSION: 1. Severe intrahepatic and extrahepatic biliary duct dilatation to the level of the pancreatic head with abrupt narrowing of the common bile duct approximately 10 mm from the ampulla. No obstructing lesion identified in the pancreatic head. No pancreatic duct dilatation. No choledocholithiasis or cholelithiasis. Presumably occult obstructing lesion in the pancreatic head versus ampulla. Recommend ERCP for relief of obstruction and potential tissue sampling. 2. No pancreatic duct dilatation or inflammation. Potential pancreas divisum variant ductal anatomy. 3. Distended gallbladder.  No evidence cholelithiasis. Electronically Signed   By: SSuzy BouchardM.D.   On: 08/16/2016 12:14   Ir Int ELianne CureBiliary Drain With Cholangiogram  Result Date: 08/18/2016 INDICATION: 56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 He has been referred for PTC and drainage. EXAM: IR CHOLANGIOGRAM PERCUTANEOUS TRANSHEPATIC MEDICATIONS: 25 mg IV Demerol ANESTHESIA/SEDATION: Moderate (conscious) sedation was employed during this procedure. A total of Versed 5.0 mg and Fentanyl 150 mcg was administered intravenously. Moderate Sedation Time: 32 minutes. The patient's level of consciousness and vital signs were monitored continuously by radiology nursing throughout the procedure under my direct supervision. FLUOROSCOPY  TIME:  Fluoroscopy Time: 6.0 minutes 0  seconds (96.7 mGy). COMPLICATIONS: None PROCEDURE: The procedure, risks, benefits, and alternatives were explained to the patient and the patient's family. A complete informed consent was performed, with risk benefit analysis. Specific risks that were discussed for the procedure include bleeding, infection, biliary sepsis, IC use day, organ injury, need for further procedure, need for further surgery, long-term drain placement, cardiopulmonary collapse, death. Questions regarding the procedure were encouraged and answered. The patient understands and consents to the procedure. Patient is position in supine position on the fluoroscopy table, and the upper abdomen was prepped and draped in the usual sterile fashion. Maximum barrier sterile technique with sterile gowns and gloves were used for the procedure. A timeout was performed prior to the initiation of the procedure. Local anesthesia was provided with 1% lidocaine with epinephrine. Ultrasound survey of the right liver lobe was performed. Window into the right biliary system was present. 1% lidocaine was used for local anesthesia, with generous infiltration of the skin and subcutaneous tissues in and inter left costal location. A Chiba needle was advanced under ultrasound guidance into the right liver lobe. Once the tip of the needle was confirmed within the biliary system by injecting small aliquots of contrast, images were stored of the biliary system after partially opacifying the biliary tree via the needle. An 018 wire was advanced centrally. The needle was removed, a small incision was made with an 11 blade scalpel, and then a triaxial Accustick system was advanced into the biliary system. The metal stiffener and dilator were removed, we confirmed placement with contrast infusion. A coaxial Glidewire and 4 French glide cath were then used to navigate across the obstruction at the ampulla. Once the catheter was advanced to the duodenum, the wire was removed  and contrast confirmed location. A Coons wire was advanced through the system, and the Accustick and Glidewire were removed. Dilation of the subcutaneous tissue tracks was performed with an 8 [place] dilator, and then a 10 [place] biliary drain was placed as an internal/external biliary drain. Small amount of contrast confirmed location. The patient tolerated the procedure well and remained hemodynamically stable throughout. No complications were encountered and no significant blood loss was encountered. IMPRESSION: Status post percutaneous transhepatic cholangiogram with placement of internal/external biliary drain across ampullary obstruction. Sample of bile sent the to lab for analysis. Signed, Dulcy Fanny. Earleen Newport, DO [facility] Electronically Signed   By: Corrie Mckusick D.O.   On: 08/18/2016 16:13    Microbiology: Recent Results (from the past 240 hour(s))  Aerobic/Anaerobic Culture (surgical/deep wound)     Status: None (Preliminary result)   Collection Time: 08/18/16  3:46 PM  Result Value Ref Range Status   Specimen Description LIVER BILE  Final   Special Requests NONE  Final   Gram Stain NO WBC SEEN NO ORGANISMS SEEN   Final   Culture   Final    NO GROWTH 4 DAYS Performed at [facility], [address], [place], [place] [address]    Report Status PENDING  Incomplete     Labs: Basic Metabolic Panel:  Recent Labs Lab 08/18/16 0512 08/19/16 0503 08/20/16 0421 08/21/16 0442 08/22/16 1018  NA 126* 130* 133* 133* 132*  K 3.4* 3.4* 3.8 3.0* 3.2*  CL 98* 100* 103 100* 99*  CO2 20* 23 24 25 25   GLUCOSE 101* 110* 90 86 108*  BUN 12 13 11 12 15   CREATININE 0.94 1.08 0.78  1.08 1.24  CALCIUM 8.4* 8.2* 8.4* 8.2* 8.2*  MG  --   --   --  1.6*  --    Liver Function Tests:  Recent Labs Lab 08/18/16 0512 08/19/16 0503 08/20/16 0421 08/21/16 0442 08/22/16 1018  AST 221* 180* 124* 80* 69*  ALT 232* 214* 168* 131* 105*   ALKPHOS 1,007* 951* 822* 695* 561*  BILITOT 16.7* 10.6* 9.9* 9.0* 8.0*  PROT 5.6* 5.7* 5.8* 6.1* 6.3*  ALBUMIN 1.7* 1.7* 1.8* 1.8* 1.8*    Recent Labs Lab 08/15/16 2222  LIPASE 117*   CBC:  Recent Labs Lab 08/15/16 2222 08/17/16 0513 08/19/16 0503  WBC 8.3 6.1 9.5  NEUTROABS  --  4.5  --   HGB 8.8* 8.3* 9.0*  HCT 25.9* 25.1* 27.1*  MCV 97.4 97.7 98.9  PLT 373 381 397    Signed:  Barton Dubois MD.  [name] 08/22/2016, 3:30 PM

## 2016-08-22 NOTE — Progress Notes (Signed)
[facility] Report called to [place], Charity fundraiserN. Currently awaiting discharge orders and transportation will be called. Dressing supplies sent to facility with pt. No questions or concerns at this time.  Lovel Suazo W Delman Goshorn, RN

## 2016-08-22 NOTE — Clinical Social Work Note (Signed)
Clinical Social Work Assessment  Patient Details  Name: Travis Pugh MRN: [id number] Date of Birth: [birth year]  Date of referral:  08/22/16               Reason for consult:  Facility Placement, Discharge Planning                Permission sought to share information with:  Family Supports, Customer service manager, Case Optician, dispensing granted to share information::  Yes, Verbal Permission Granted  Name::      Risk manager )  Agency::   (SNF's )  Relationship::   ([place] )  Contact Information:   [phone])  Housing/Transportation Living arrangements for the past 2 months:  International Paper of Information:  Patient, Other (Comment Required) Engineer, petroleum ) Patient Interpreter Needed:  None Criminal Activity/Legal Involvement Pertinent to Current Situation/Hospitalization:  No - Comment as needed Significant Relationships:  Other(Comment) (Aunt ) Lives with:  Other (Comment) (Boarding home ) Do you feel safe going back to the place where you live?  No Need for family participation in patient care:  No (Coment)  Care giving concerns:  Patient from home alone (boarding house). Patient does not have family in the are. Aunt, Travis Pugh lives in [place] who is strongly involved in care. Patient requiring SNF placement to assist with biliary drain.   Social Worker assessment / plan:   MSW met with patient at bedside in reference to post-acute placement for SNF. MSW introduced MSW role and SNF process. Patient contacted his aunt, Travis Pugh who also joined discussion via speaker phone. MSW explained that due to lack of skilled needs patient's SNF options are limited. MSW presented confirmed facility, [facility] has patient only option currently. Facility has reviewed case in depth and has confirm bed offer for drain management. MSW provided pt's aunt with contact information and facility address. Patient and aunt agreeable to placement at [facility]. No further concerns  reported at this time. MSW will continue to follow pt and pt's family for continued support as needed.   Employment status:  Disabled (Comment on whether or not currently receiving Disability), Retired Forensic scientist:  Medicare PT Recommendations:  No Follow Up, [place] / Referral to community resources:  [place]  Patient/Family's Response to care: Patient a/o x4. Patient and family agreeable to SNF placement at [facility]. Pt's aunt involved in care and has facility information. Pt and family appreciated social work intervention.   Patient/Family's Understanding of and Emotional Response to Diagnosis, Current Treatment, and Prognosis:  Patient and aunt, Travis Pugh knowledgeable of need for assistance with drain and location of drain. MSW unsure how long patient will need drain and if patient can remain at SNF for total expectancy of drain- patient and family expressed understanding.  Emotional Assessment Appearance:  Appears stated age Attitude/Demeanor/Rapport:   (Polite ) Affect (typically observed):  Accepting, Appropriate, Pleasant Orientation:  Oriented to Situation, Oriented to  Time, Oriented to Place, Oriented to Self Alcohol / Substance use:  Not Applicable Psych involvement (Current and /or in the community):  No (Comment)  Discharge Needs  Concerns to be addressed:  Care Coordination Readmission within the last 30 days:  No Current discharge risk:  Lives alone Barriers to Discharge:  Barriers Resolved   Glendon Axe A 08/22/2016, 1:29 PM

## 2016-08-22 NOTE — [place] FL2 (Signed)
[place] MEDICAID FL2 LEVEL OF CARE SCREENING TOOL     IDENTIFICATION  Patient Name: Travis Pugh Birthdate: 1961-01-08 Sex: male Admission Date (Current Location): 08/15/2016  [facility] and [place] Number:  Producer, television/film/video and Address:  [facility],  [address], [place] [address]      Provider Number: [id number]  Attending Physician Name and Address:  Vassie Loll, MD  Relative Name and Phone Number:       Current Level of Care: Hospital Recommended Level of Care: Skilled Nursing Facility Prior Approval Number:    Date Approved/Denied:   PASRR Number:  [id number])  Discharge Plan: SNF    Current Diagnoses: Patient Active Problem List   Diagnosis Date Noted  . Elevated LFTs   . Jaundice 08/16/2016  . Bile duct obstruction 08/16/2016  . AKI (acute kidney injury) (HCC) 08/16/2016  . Anemia 08/16/2016    Orientation RESPIRATION BLADDER Height & Weight     Self, Time, Situation, Place  Normal Continent Weight: 155 lb (70.3 kg) Height:  5\' 9"  (175.3 cm)  BEHAVIORAL SYMPTOMS/MOOD NEUROLOGICAL BOWEL NUTRITION STATUS   (none )  (none ) Continent Diet (Soft )  AMBULATORY STATUS COMMUNICATION OF NEEDS Skin   Limited Assist Verbally Normal                       Personal Care Assistance Level of Assistance  Dressing, Bathing, Feeding Bathing Assistance: Limited assistance Feeding assistance: Independent Dressing Assistance: Limited assistance     Functional Limitations Info  Speech, Hearing, Sight Sight Info: Adequate Hearing Info: Adequate Speech Info: Adequate    SPECIAL CARE FACTORS FREQUENCY  PT (By licensed PT)     PT Frequency: 3              Contractures      Additional Factors Info  Code Status, Allergies Code Status Info: FULL CODE  Allergies Info: N/A           Current Medications (08/22/2016):  This is the current hospital active medication list Current Facility-Administered Medications  Medication  Dose Route Frequency Provider Last Rate Last Dose  . 0.9 %  sodium chloride infusion   Intravenous Continuous Lenox Ponds, MD 100 mL/hr at 08/22/16 0500    . acetaminophen (TYLENOL) tablet 650 mg  650 mg Oral Q6H PRN Lenox Ponds, MD   650 mg at 08/22/16 0045   Or  . acetaminophen (TYLENOL) suppository 650 mg  650 mg Rectal Q6H PRN Lenox Ponds, MD      . docusate sodium (COLACE) capsule 100 mg  100 mg Oral BID Vassie Loll, MD   100 mg at 08/20/16 1445  . heparin injection 5,000 Units  5,000 Units Subcutaneous Q8H Barnetta Chapel, PA-C   5,000 Units at 08/22/16 0533  . lactated ringers infusion   Intravenous Continuous Dorena Cookey, MD      . ondansetron [facility]) tablet 4 mg  4 mg Oral Q6H PRN Lenox Ponds, MD       Or  . ondansetron [facility]) injection 4 mg  4 mg Intravenous Q6H PRN Lenox Ponds, MD   4 mg at 08/19/16 0143  . polyethylene glycol (MIRALAX / GLYCOLAX) packet 17 g  17 g Oral Daily Vassie Loll, MD   17 g at 08/22/16 0949  . senna-docusate (Senokot-S) tablet 1 tablet  1 tablet Oral QHS PRN Lenox Ponds, MD         Discharge Medications: Please see  discharge summary for a list of discharge medications.  Relevant Imaging Results:  Relevant Lab Results:   Additional Information SSN [id number]  Derenda Fennelixon, Maddison Kilner A

## 2016-08-22 NOTE — Progress Notes (Signed)
Referring Physician(s):  Dr. Teena Irani  Supervising Physician: Sandi Mariscal  Patient Status:  [facility] - In-pt  Chief Complaint:  Nausea, vomiting  Subjective: Resting comfortably at time of visit.  Happy his numbers are coming down, but has questions about long-term plans.  States his appetite has improved.  Allergies: Patient has no known allergies.  Medications: Prior to Admission medications   Medication Sig Start Date End Date Taking? Authorizing Provider  aspirin-sod bicarb-citric acid (ALKA-SELTZER) 325 MG TBEF tablet Take 325 mg by mouth every 6 (six) hours as needed.   Yes Historical Provider, MD  Multiple Vitamins-Minerals (EMERGEN-C VITAMIN C PO) Take 1 packet by mouth daily.   Yes Historical Provider, MD  Phenylephrine-DM (THERAFLU COLD/COUGH DAYTIME PO) Take 1 capsule by mouth daily.   Yes Historical Provider, MD     Vital Signs: BP 130/88 (BP Location: Left Arm)   Pulse 81   Temp 98.6 F (37 C) (Oral)   Resp 16   Ht 5' 9"  (1.753 m)   Wt 155 lb (70.3 kg)   SpO2 100%   BMI 22.89 kg/m   Physical Exam  Constitutional: He appears well-developed.  Abdominal:  Soft, non-tender, internal/external biliary drain in place with brown bilious ouput.  Insertion site c/d/i.  Nursing note and vitals reviewed.   Imaging: Ir Int Lianne Cure Biliary Drain With Cholangiogram  Result Date: 08/18/2016 INDICATION: 56 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 He has been referred for PTC and drainage. EXAM: IR CHOLANGIOGRAM PERCUTANEOUS TRANSHEPATIC MEDICATIONS: 25 mg IV Demerol ANESTHESIA/SEDATION: Moderate (conscious) sedation was employed during this procedure. A total of Versed 5.0 mg and Fentanyl 150 mcg was administered intravenously. Moderate Sedation Time: 32 minutes. The patient's level of consciousness and vital signs were monitored continuously by radiology nursing throughout the procedure under my direct supervision. FLUOROSCOPY TIME:   Fluoroscopy Time: 6.0 minutes 0 seconds (96.7 mGy). COMPLICATIONS: None PROCEDURE: The procedure, risks, benefits, and alternatives were explained to the patient and the patient's family. A complete informed consent was performed, with risk benefit analysis. Specific risks that were discussed for the procedure include bleeding, infection, biliary sepsis, IC use day, organ injury, need for further procedure, need for further surgery, long-term drain placement, cardiopulmonary collapse, death. Questions regarding the procedure were encouraged and answered. The patient understands and consents to the procedure. Patient is position in supine position on the fluoroscopy table, and the upper abdomen was prepped and draped in the usual sterile fashion. Maximum barrier sterile technique with sterile gowns and gloves were used for the procedure. A timeout was performed prior to the initiation of the procedure. Local anesthesia was provided with 1% lidocaine with epinephrine. Ultrasound survey of the right liver lobe was performed. Window into the right biliary system was present. 1% lidocaine was used for local anesthesia, with generous infiltration of the skin and subcutaneous tissues in and inter left costal location. A Chiba needle was advanced under ultrasound guidance into the right liver lobe. Once the tip of the needle was confirmed within the biliary system by injecting small aliquots of contrast, images were stored of the biliary system after partially opacifying the biliary tree via the needle. An 018 wire was advanced centrally. The needle was removed, a small incision was made with an 11 blade scalpel, and then a triaxial Accustick system was advanced into the biliary system. The metal stiffener and dilator were removed, we confirmed placement with contrast infusion. A coaxial Glidewire and 4 [place] glide cath  were then used to navigate across the obstruction at the ampulla. Once the catheter was advanced to the  duodenum, the wire was removed and contrast confirmed location. A Coons wire was advanced through the system, and the Accustick and Glidewire were removed. Dilation of the subcutaneous tissue tracks was performed with an 8 [place] dilator, and then a 10 [place] biliary drain was placed as an internal/external biliary drain. Small amount of contrast confirmed location. The patient tolerated the procedure well and remained hemodynamically stable throughout. No complications were encountered and no significant blood loss was encountered. IMPRESSION: Status post percutaneous transhepatic cholangiogram with placement of internal/external biliary drain across ampullary obstruction. Sample of bile sent the to lab for analysis. Signed, Dulcy Fanny. Earleen Newport, DO [facility] Electronically Signed   By: Corrie Mckusick D.O.   On: 08/18/2016 16:13    Labs:  CBC:  Recent Labs  08/15/16 2222 08/17/16 0513 08/19/16 0503  WBC 8.3 6.1 9.5  HGB 8.8* 8.3* 9.0*  HCT 25.9* 25.1* 27.1*  PLT 373 381 397    COAGS:  Recent Labs  08/15/16 2222  INR 1.23  APTT 35    BMP:  Recent Labs  08/19/16 0503 08/20/16 0421 08/21/16 0442 08/22/16 1018  NA 130* 133* 133* 132*  K 3.4* 3.8 3.0* 3.2*  CL 100* 103 100* 99*  CO2 23 24 25 25   GLUCOSE 110* 90 86 108*  BUN 13 11 12 15   CALCIUM 8.2* 8.4* 8.2* 8.2*  CREATININE 1.08 0.78 1.08 1.24  GFRNONAA >60 >60 >60 >60  GFRAA >60 >60 >60 >60    LIVER FUNCTION TESTS:  Recent Labs  08/19/16 0503 08/20/16 0421 08/21/16 0442 08/22/16 1018  BILITOT 10.6* 9.9* 9.0* 8.0*  AST 180* 124* 80* 69*  ALT 214* 168* 131* 105*  ALKPHOS 951* 822* 695* 561*  PROT 5.7* 5.8* 6.1* 6.3*  ALBUMIN 1.7* 1.8* 1.8* 1.8*    Assessment and Plan: Obstructive jaundice s/p internal/external biliary drain placement 2/9 by Dr. Earleen Newport. Total bili continues to decrease  8.0 (2/13) Drain remains to gravity with +output.  5752m over  the past 24 hrs.   Electrolytes monitored daily. Cultures still pending; no growth thus far. IR following.   Electronically Signed: KDocia Barrier2/13/2018, 11:17 AM   I spent a total of 15 Minutes at the the patient's bedside AND on the patient's hospital floor or unit, greater than 50% of which was counseling/coordinating care for obstructive jaundice.

## 2016-08-22 NOTE — Clinical Social Work Note (Signed)
Medical Social Worker facilitated patient discharge including contacting patient family and facility to confirm patient discharge plans.  Clinical information faxed to facility and family agreeable with plan.  MSW [facility].  RN to call report prior to discharge.  Medical Social Worker will sign off for now as social work intervention is no longer needed. Please consult us again if new need arises.  Travis Pugh, MSW [phone] 08/22/2016 3:51 PM

## 2016-08-22 NOTE — Progress Notes (Signed)
[name] Subjective patient feels better ate a whole omelette this morning is feeling stronger. Still having some pain at the insertion site to the biliary drain not entirely clear whether he's can to go after leaving the hospital  Objective: Vital signs in last 24 hours: Temp:  [98.5 F (36.9 C)-100 F (37.8 C)] 98.6 F (37 C) (02/13 0518) Pulse Rate:  [81-93] 81 (02/13 0518) Resp:  [16] 16 (02/13 0518) BP: (130-147)/(86-88) 130/88 (02/13 0518) SpO2:  [100 %] 100 % (02/13 0518) Last BM Date:  (pt. unable to remember)  Intake/Output from previous day: 02/12 0701 - 02/13 0700 In: 3815 [P.O.:2060; I.V.:1755] Out: 7630 [Urine:1900; Drains:5730] Intake/Output this shift: Total I/O In: 250 [P.O.:240; Other:10] Out: 750 [Urine:200; Drains:550]  PE: General-- patient no distress  Abdomen-- soft and nontender  Lab Results: No results for input(s): WBC, HGB, HCT, PLT in the last 72 hours. BMET  Recent Labs  08/20/16 0421 08/21/16 0442  NA 133* 133*  K 3.8 3.0*  CL 103 100*  CO2 24 25  CREATININE 0.78 1.08   LFT  Recent Labs  08/20/16 0421 08/21/16 0442  PROT 5.8* 6.1*  AST 124* 80*  ALT 168* 131*  ALKPHOS 822* 695*  BILITOT 9.9* 9.0*   PT/INR No results for input(s): LABPROT, INR in the last 72 hours. PANCREAS No results for input(s): LIPASE in the last 72 hours.       Studies/Results: No results found.  Medications: I have reviewed the patient's current medications.  Assessment/Plan: 1. 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 Cytology on the bile is still pending. Biliary obstruction is doing better with percutaneous training. He continues to get stronger. He is scheduled for office visit as outpatient with Dr. Dulce Sellarutlaw on Tuesday 2/22 arrive at our office at 1 PM. The purpose of this visit will be to discuss EUS and biopsy/ERCP etc. I have [name] made this appointment in the patient and his  family are aware.   Callahan Wild JR,Brittney Caraway L 08/22/2016, 9:33 AM  This note was created using voice recognition software. Minor errors may Have occurred unintentionally.  Pager: [phone] If no answer or after hours call [phone]

## 2016-08-22 NOTE — Clinical Social Work Placement (Signed)
   CLINICAL SOCIAL WORK PLACEMENT  NOTE  Date:  08/22/2016  Patient Details  Name: Royetta AsalRonald Maciver MRN: [id number] Date of Birth: 31-Dec-1960  Clinical Social Work is seeking post-discharge placement for this patient at the Skilled  Nursing Facility level of care (*CSW will initial, date and re-position this form in  chart as items are completed):  Yes   Patient/family provided with [facility] Clinical Social Work Department's list of facilities offering this level of care within the geographic area requested by the patient (or if unable, by the patient's family).  Yes   Patient/family informed of their freedom to choose among providers that offer the needed level of care, that participate in Medicare, Medicaid or managed care program needed by the patient, have an available bed and are willing to accept the patient.  Yes   Patient/family informed of [facility]'s ownership interest in [facility] and [facility], as well as of the fact that they are under no obligation to receive care at these facilities.  PASRR submitted to EDS on 08/22/16     PASRR number received on 08/22/16     Existing PASRR number confirmed on       FL2 transmitted to all facilities in geographic area requested by pt/family on 08/22/16     FL2 transmitted to all facilities within larger geographic area on       Patient informed that his/her managed care company has contracts with or will negotiate with certain facilities, including the following:        Yes   Patient/family informed of bed offers received.  Patient [facility] bed [facility]  [facility]     Physician recommends and patient chooses bed at      Patient to be transferred to  [facility] on 08/22/16.  Patient to be transferred to facility by  Sh[facility] Mons([facility] )     Patient family notified on 08/22/16 of transfer.  Name of family member notified:   (Pt's aunt, Systems developerHonor )     PHYSICIAN Please prepare priority discharge  summary, including medications, Please sign FL2, Please prepare prescriptions     Additional Comment:    _______________________________________________ Derenda FennelNixon, Jaidin Richison A 08/22/2016, 1:39 PM

## 2016-08-23 LAB — AEROBIC/ANAEROBIC CULTURE W GRAM STAIN (SURGICAL/DEEP WOUND): Culture: NO GROWTH

## 2016-08-23 LAB — AEROBIC/ANAEROBIC CULTURE (SURGICAL/DEEP WOUND): GRAM STAIN: NONE SEEN

## 2016-08-24 ENCOUNTER — Encounter: Payer: Self-pay | Admitting: Adult Health

## 2016-08-24 ENCOUNTER — Non-Acute Institutional Stay (SKILLED_NURSING_FACILITY): Payer: Medicare Other | Admitting: Adult Health

## 2016-08-24 DIAGNOSIS — E876 Hypokalemia: Secondary | ICD-10-CM

## 2016-08-24 DIAGNOSIS — K831 Obstruction of bile duct: Secondary | ICD-10-CM | POA: Diagnosis not present

## 2016-08-24 DIAGNOSIS — D649 Anemia, unspecified: Secondary | ICD-10-CM | POA: Diagnosis not present

## 2016-08-24 DIAGNOSIS — R17 Unspecified jaundice: Secondary | ICD-10-CM | POA: Diagnosis not present

## 2016-08-24 DIAGNOSIS — K5901 Slow transit constipation: Secondary | ICD-10-CM | POA: Insufficient documentation

## 2016-08-24 NOTE — Progress Notes (Signed)
Location:   Psychiatrist of Service:  SNF (31)   CODE STATUS: full code   No Known Allergies  Chief Complaint  Patient presents with  . Hospitalization Follow-up    HPI:  He has been hospitalized following a 2 week history of increased weakness and jaundice. He has a common bile duct obstruction with painless jaundice. He had a ERCP with failure to place stent. He has an elevated CA 19-9. He has anemia without signs of bleed with a ferritin of 1656; suggestive of chronic disease. He may need an outpatient colonoscopy. He is here for short term rehab. His goal is to return back home.  He tells me that he is feeling better and has no complaints. There are no nursing concerns at this time.  His urine drug screen did return positive for cocaine  Past Medical History:  Diagnosis Date  . Anemia 08/16/2016  . 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 Elevated LFTs   . Hypokalemia   . Jaundice 08/16/2016     Past Surgical History:  Procedure Laterality Date  . ERCP N/A 08/17/2016   Procedure: ENDOSCOPIC RETROGRADE CHOLANGIOPANCREATOGRAPHY (ERCP);  Surgeon: Teena Irani, MD;  Location: [facility];  Service: Endoscopy;  Laterality: N/A;  . EYE SURGERY     Right  . IR GENERIC HISTORICAL  08/18/2016   IR INT EXT BILIARY DRAIN WITH CHOLANGIOGRAM 08/18/2016 Corrie Mckusick, DO [facility]INTER[facility] RAD    Social History   Social History  . Marital status: Single    Spouse name: N/A  . Number of children: N/A  . Years of education: N/A   Occupational History  . Not on file.   Social History Main Topics  . Smoking status: Former Research scientist (life sciences)  . Smokeless tobacco: Never Used  . Alcohol use No     Comment: Former drinker  . Drug use: No  . Sexual activity: Not on file   Other Topics Concern  . Not on file   Social History Narrative  . No narrative on file    History reviewed. No pertinent family history.   VITAL SIGNS BP 118/76   Pulse 78   Temp 98 F (36.7 C)   Resp 18   Ht  5' 9"  (1.753 m)   Wt 155 lb (70.3 kg)   SpO2 94%   BMI 22.89 kg/m   Patient's Medications  New Prescriptions   No medications on file  Previous Medications   ACETAMINOPHEN (TYLENOL) 325 MG TABLET    Take 2 tablets (650 mg total) by mouth every 6 (six) hours as needed for mild pain or headache (or Fever >/= 101).   DOCUSATE SODIUM (COLACE) 100 MG CAPSULE    Take 1 capsule (100 mg total) by mouth 2 (two) times daily. Hold for diarrhea   MULTIPLE VITAMINS-MINERALS (EMERGEN-C VITAMIN C PO)    Take 1 packet by mouth daily.   POLYETHYLENE GLYCOL (MIRALAX / GLYCOLAX) PACKET    Take 17 g by mouth daily.  Modified Medications   No medications on file  Discontinued Medications   No medications on file     SIGNIFICANT DIAGNOSTIC EXAMS  08-15-16: chest x-ray: No active cardiopulmonary disease.  08-15-16: abdominal ultrasound: Gallbladder is distended and diffusely sludge filled with positive Murphy's sign. No stones are identified but changes could indicate cholecystitis. Intra and extrahepatic bile duct dilatation is present. No cause is identified but the distal common bile duct is not visualized.  08-15-16: ct of abdomen and  pelvis: Severe intra and extrahepatic biliary ductal dilatation and gallbladder enlargement with sharp tapering to occlusion of the common bile duct within the pancreatic head. No obstructing radiopaque gallstone identified. Possible mass near the sphincter of Oddi. No pancreatic ductal dilatation. MRI/MRCP of the abdomen with and without contrast is recommended.  08-16-16: MRI abdomen with mrcp: 1. Severe intrahepatic and extrahepatic biliary duct dilatation to the level of the pancreatic head with abrupt narrowing of the common bile duct approximately 10 mm from the ampulla. No obstructing lesion identified in the pancreatic head. No pancreatic duct dilatation. No choledocholithiasis or cholelithiasis. Presumably occult obstructing lesion in the pancreatic head versus ampulla.  Recommend ERCP for relief of obstruction and potential tissue sampling. 2. No pancreatic duct dilatation or inflammation. Potential pancreas divisum variant ductal anatomy. 3. Distended gallbladder.  No evidence cholelithiasis.  08-17-16: ERCP:  The major papilla was bulging. The ventral pancreatic duct was deeply cannulated. Ductal flow or contrast was adequate. Contrast extended to the distal pancreatic duct. Opacification of the main pancreatic duct was done. The maximum of the duct was 2 mm. The entire opacified area was normal. The bile duct could not be cannulated.   08-17-16: kub Failed ERCP.  08-18-16: IR biliary drain with cholangiogram: Status post percutaneous transhepatic cholangiogram with placement of internal/external biliary drain across ampullary obstruction. Sample of bile sent the to lab for analysis.   LABS REVIEWED:   08-15-16: wbc 8.3; hgb 8.8 hct 97.4; mcv 97.4; plt 373; glucose 98; bun 24; creat 1.36; k+ 3.6; na++ 126; ast 407; alt 330; alk phos 1140; total bili 24.9; albumin 2.2; acute hepatitis panel: negative; urine drug screen: + cocaine 08-16-16; vit B 12; folate 19.1; iron 48; ferritin 1656; HIV: nr 08-17-16: wbc 6.1; hgb 8.3; hct 25.1; mcv 97.7; plt 381; glucose 99; bun 15; creat 0.95; k+ 3.8; na++ 129; ast 243; alt 258; alk phos 1048; total bili 9.0; albumin 1.8 CA 19-1: 1171 08-19-16: wbc 9.5; hgb 9.0; hct 27.1; mcv 98.9; plt 397  08-21-16: glucose 86; bun 12; creat 1.08; k+ 3.0; na++ 133; ast 80; alt 131; alk phos 695; total bili: 9.0; albumin 1.8; mag 1.6 08-22-16: glucose 108; bun 15; creat 1.24; k+ 3.2 ;na++ 132; ast 69; alt 105; alk phos 561; total bili: 8.0; albumin 1.8    Review of Systems  Constitutional: Negative for malaise/fatigue.  Respiratory: Negative for cough and shortness of breath.   Cardiovascular: Negative for chest pain, palpitations and leg swelling.  Gastrointestinal: Negative for abdominal pain, constipation and heartburn.  Musculoskeletal:  Negative for back pain, joint pain and myalgias.  Skin: Negative.        Has biliary drain   Neurological: Negative for dizziness.  Psychiatric/Behavioral: The patient is not nervous/anxious.      Physical Exam  Constitutional: He is oriented to person, place, and time. No distress.  Thin   Eyes: Conjunctivae are normal.  Sclera icteric   Neck: Neck supple. No JVD present. No thyromegaly present.  Cardiovascular: Normal rate, regular rhythm and intact distal pulses.   Respiratory: Effort normal and breath sounds normal. No respiratory distress. He has no wheezes.  GI: Soft. Bowel sounds are normal. He exhibits no distension. There is no tenderness.  Has biliary drain with yellow fluid return site without signs of infection present.   Musculoskeletal: He exhibits no edema.  Able to move all extremities   Lymphadenopathy:    He has no cervical adenopathy.  Neurological: He is alert and oriented to person, place,  and time.  Skin: Skin is warm and dry. He is not diaphoretic.  Psychiatric: He has a normal mood and affect.      ASSESSMENT/ PLAN:  1. Anemia: hgb 9.0; ferritin 1656; suggestive of chronic disease; will monitor   2. Hypokalemia: k+ 3.2; will monitor   3. Common bile duct obstruction; failed ercp; has bilary drain in place  elevated liver enzymes are improving will continue to monitor his liver function  4. Constipation: will continue colace twice daily and miralax daily   5. Jaundice: sclera are icteric he tells me that his eyes are improving he denies itching.    Will check cbc; cmp; mag in the AM   Time spent with patient  50   minutes >50% time spent counseling; reviewing medical record; tests; labs; and developing future plan of care     Ok Edwards NP [facility]  Contact [phone] Monday through Friday 8am- 5pm  After hours call [phone]

## 2016-08-25 LAB — CBC AND DIFFERENTIAL
HEMATOCRIT: 28 % — AB (ref 41–53)
HEMOGLOBIN: 9.1 g/dL — AB (ref 13.5–17.5)
PLATELETS: 380 10*3/uL (ref 150–399)
WBC: 8.4 10^3/mL

## 2016-08-25 LAB — HEPATIC FUNCTION PANEL
ALK PHOS: 606 U/L — AB (ref 25–125)
ALT: 99 U/L — AB (ref 10–40)
AST: 79 U/L — AB (ref 14–40)
BILIRUBIN, TOTAL: 7.9 mg/dL

## 2016-08-25 LAB — BASIC METABOLIC PANEL
BUN: 16 mg/dL (ref 4–21)
Creatinine: 1.1 mg/dL (ref 0.6–1.3)
Glucose: 91 mg/dL
Potassium: 4 mmol/L (ref 3.4–5.3)
Sodium: 135 mmol/L — AB (ref 137–147)

## 2016-08-29 ENCOUNTER — Other Ambulatory Visit (HOSPITAL_COMMUNITY): Payer: Self-pay | Admitting: Interventional Radiology

## 2016-08-29 ENCOUNTER — Ambulatory Visit (HOSPITAL_COMMUNITY)
Admission: RE | Admit: 2016-08-29 | Discharge: 2016-08-29 | Disposition: A | Discharge status: Home / Self Care | Payer: Medicare Other | Source: Ambulatory Visit | Attending: Interventional Radiology | Admitting: Interventional Radiology

## 2016-08-29 ENCOUNTER — Encounter: Payer: Self-pay | Admitting: Internal Medicine

## 2016-08-29 ENCOUNTER — Non-Acute Institutional Stay (SKILLED_NURSING_FACILITY): Payer: Medicare Other | Admitting: Internal Medicine

## 2016-08-29 DIAGNOSIS — R7989 Other specified abnormal findings of blood chemistry: Secondary | ICD-10-CM | POA: Diagnosis not present

## 2016-08-29 DIAGNOSIS — E876 Hypokalemia: Secondary | ICD-10-CM | POA: Diagnosis not present

## 2016-08-29 DIAGNOSIS — K831 Obstruction of bile duct: Secondary | ICD-10-CM

## 2016-08-29 DIAGNOSIS — T85638A Leakage of other specified internal prosthetic devices, implants and grafts, initial encounter: Secondary | ICD-10-CM | POA: Insufficient documentation

## 2016-08-29 DIAGNOSIS — R945 Abnormal results of liver function studies: Secondary | ICD-10-CM

## 2016-08-29 DIAGNOSIS — F1490 Cocaine use, unspecified, uncomplicated: Secondary | ICD-10-CM

## 2016-08-29 DIAGNOSIS — R978 Other abnormal tumor markers: Secondary | ICD-10-CM | POA: Diagnosis not present

## 2016-08-29 DIAGNOSIS — F1410 Cocaine abuse, uncomplicated: Secondary | ICD-10-CM

## 2016-08-29 DIAGNOSIS — R17 Unspecified jaundice: Secondary | ICD-10-CM | POA: Diagnosis not present

## 2016-08-29 DIAGNOSIS — D638 Anemia in other chronic diseases classified elsewhere: Secondary | ICD-10-CM

## 2016-08-29 DIAGNOSIS — Y828 Other medical devices associated with adverse incidents: Secondary | ICD-10-CM | POA: Diagnosis not present

## 2016-08-29 HISTORY — PX: IR GENERIC HISTORICAL: IMG1180011

## 2016-08-29 MED ORDER — LIDOCAINE HCL (PF) 1 % IJ SOLN
INTRAMUSCULAR | Status: AC
  Filled 2016-08-29: qty 30

## 2016-08-29 MED ORDER — LIDOCAINE HCL 1 % IJ SOLN
INTRAMUSCULAR | Status: DC | PRN
  Administered 2016-08-29: 10 mL

## 2016-08-29 MED ORDER — IOPAMIDOL (ISOVUE-300) INJECTION 61%
INTRAVENOUS | Status: AC
  Administered 2016-08-29: 10 mL
  Filled 2016-08-29: qty 50
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
## 2016-09-08 ENCOUNTER — Encounter (HOSPITAL_COMMUNITY): Payer: Self-pay

## 2016-09-08 ENCOUNTER — Emergency Department (HOSPITAL_COMMUNITY)
Admission: EM | Admit: 2016-09-08 | Discharge: 2016-09-08 | Disposition: A | Discharge status: Home / Self Care | Payer: Medicare Other | Attending: Emergency Medicine | Admitting: Emergency Medicine

## 2016-09-08 ENCOUNTER — Non-Acute Institutional Stay (SKILLED_NURSING_FACILITY): Payer: Medicare Other | Admitting: Adult Health

## 2016-09-08 ENCOUNTER — Encounter: Payer: Self-pay | Admitting: Internal Medicine

## 2016-09-08 ENCOUNTER — Encounter: Payer: Self-pay | Admitting: Adult Health

## 2016-09-08 DIAGNOSIS — N179 Acute kidney failure, unspecified: Secondary | ICD-10-CM | POA: Diagnosis not present

## 2016-09-08 DIAGNOSIS — E871 Hypo-osmolality and hyponatremia: Secondary | ICD-10-CM | POA: Insufficient documentation

## 2016-09-08 DIAGNOSIS — E875 Hyperkalemia: Secondary | ICD-10-CM | POA: Diagnosis not present

## 2016-09-08 DIAGNOSIS — Z87891 Personal history of nicotine dependence: Secondary | ICD-10-CM | POA: Diagnosis not present

## 2016-09-08 LAB — CBC
HEMATOCRIT: 28.3 % — AB (ref 39.0–52.0)
Hemoglobin: 10.1 g/dL — ABNORMAL LOW (ref 13.0–17.0)
MCH: 32.5 pg (ref 26.0–34.0)
MCHC: 35.7 g/dL (ref 30.0–36.0)
MCV: 91 fL (ref 78.0–100.0)
Platelets: 342 10*3/uL (ref 150–400)
RBC: 3.11 MIL/uL — ABNORMAL LOW (ref 4.22–5.81)
RDW: 13.4 % (ref 11.5–15.5)
WBC: 7.8 10*3/uL (ref 4.0–10.5)

## 2016-09-08 LAB — COMPREHENSIVE METABOLIC PANEL
ALBUMIN: 2.9 g/dL — AB (ref 3.5–5.0)
ALT: 56 U/L (ref 17–63)
AST: 39 U/L (ref 15–41)
Alkaline Phosphatase: 272 U/L — ABNORMAL HIGH (ref 38–126)
Anion gap: 9 (ref 5–15)
BILIRUBIN TOTAL: 4 mg/dL — AB (ref 0.3–1.2)
BUN: 50 mg/dL — ABNORMAL HIGH (ref 6–20)
CO2: 19 mmol/L — ABNORMAL LOW (ref 22–32)
Calcium: 9.2 mg/dL (ref 8.9–10.3)
Chloride: 97 mmol/L — ABNORMAL LOW (ref 101–111)
Creatinine, Ser: 1.86 mg/dL — ABNORMAL HIGH (ref 0.61–1.24)
GFR calc Af Amer: 45 mL/min — ABNORMAL LOW (ref >60)
GFR calc non Af Amer: 39 mL/min — ABNORMAL LOW (ref >60)
GLUCOSE: 107 mg/dL — AB (ref 65–99)
POTASSIUM: 4.5 mmol/L (ref 3.5–5.1)
Sodium: 125 mmol/L — ABNORMAL LOW (ref 135–145)
TOTAL PROTEIN: 7.5 g/dL (ref 6.5–8.1)

## 2016-09-08 MED ORDER — SODIUM CHLORIDE 0.9 % IV BOLUS (SEPSIS)
1000.0000 mL | Freq: Once | INTRAVENOUS | Status: AC
  Administered 2016-09-08: 1000 mL via INTRAVENOUS

## 2016-09-08 NOTE — ED Triage Notes (Signed)
Pt. Coming from [facility] for abnormal lab values. Pt. Being released today when renal values elevated (BUN 33.7 and Cr 1.58). Pt. There for biliary obstruction with biliary drain. Staff states the drain has had more output recently. Pt. Ambulatory and [facility].
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
## 2016-09-08 NOTE — ED Provider Notes (Signed)
[facility]-EM[facility]Y DEPT Provider Note   CSN: [id number] Arrival date & time: 09/08/16  1558     History   Chief Complaint Chief Complaint  Patient presents with  . Abnormal Lab    HPI Travis Pugh is a 56 y.o. male.  HPI  56 y.o. 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 Noted to have CBD obstruction with painless jaundice. CT Abdomen at the time showed severe intra and extrahepatic ductal duct dilatation and gallbladder enlargement. ERCP done with failure to place stent. Pt does have biliary drain placed. Labs at facility showed Hyponatremia 126 as well as Potassium 5.4. Elevated BUN (33.7) and Creatinine (1.58). Sent to ED for evaluation.  Chart Review shows MRI Abdomen on 08-16-16 with 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   Past Medical History:  Diagnosis Date  . Anemia 08/16/2016  . Cocaine abuse   . 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 Elevated LFTs   . Hypokalemia   . Jaundice 08/16/2016  . Protein-calorie malnutrition, severe [facility](HCC)     Patient Active Problem List   Diagnosis Date Noted  . Acute renal failure (ARF) (HCC) 09/08/2016  . Hyponatremia 09/08/2016  . Hyperkalemia 09/08/2016  . Elevated CA 19-9 level 08/29/2016  . Constipation, slow transit 08/24/2016  . Hypokalemia   . Elevated LFTs   . Jaundice 08/16/2016  . 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 AKI (acute kidney injury) (HCC) 08/16/2016  . Anemia 08/16/2016    Past Surgical History:  Procedure Laterality Date  . ERCP N/A 08/17/2016   Procedure: ENDOSCOPIC RETROGRADE CHOLANGIOPANCREATOGRAPHY (ERCP);  Surgeon: Dorena CookeyJohn Hayes, MD;  [facility];  Service: Endoscopy;  Laterality: N/A;  . EYE SURGERY     Right  . IR GENERIC HISTORICAL  08/18/2016   IR INT EXT BILIARY DRAIN WITH CHOLANGIOGRAM 08/18/2016 Gilmer MorJaime Wagner, DO [facility]-INTERV RAD  . IR GENERIC  HISTORICAL  08/29/2016   IR EXCHANGE BILIARY DRAIN 08/29/2016 Jolaine ClickArthur Hoss, MD [facility]       Home Medications    Prior to Admission medications   Medication Sig Start Date End Date Taking? Authorizing Provider  acetaminophen (TYLENOL) 325 MG tablet Take by mouth every 6 (six) hours as needed.    Historical Provider, MD  docusate sodium (COLACE) 100 MG capsule Take 1 capsule (100 mg total) by mouth 2 (two) times daily. Hold for diarrhea 08/22/16   Vassie Lollarlos Madera, MD  Multiple Vitamins-Minerals (EMERGEN-C VITAMIN C PO) Take 1 packet by mouth daily.    Historical Provider, MD  polyethylene glycol (MIRALAX / GLYCOLAX) packet Take 17 g by mouth daily. 08/23/16   Vassie Lollarlos Madera, MD  sodium chloride 0.9 % injection Flush Bilary tube with 10 cc of normal saline every shift    Historical Provider, MD    Family History Family History  Problem Relation Age of Onset  . Family history unknown: Yes    Social History Social History  Substance Use Topics  . Smoking status: Former Games developermoker  . Smokeless tobacco: Never Used  . Alcohol use No     Comment: Former drinker     Allergies   Patient has no known allergies.   Review of Systems Review of Systems ROS reviewed and all are negative for acute change except as noted in the HPI.  Physical Exam Updated Vital Signs BP 99/60 (BP Location: Left Arm)   Pulse 81   Temp  98.5 F (36.9 C) (Oral)   Resp 17   Ht 5\' 8"  (1.727 m)   Wt 60.3 kg   SpO2 100%   BMI 20.22 kg/m   Physical Exam  Constitutional: He is oriented to person, place, and time. Vital signs are normal. He appears well-developed and well-nourished.  HENT:  Head: Normocephalic and atraumatic.  Right Ear: Hearing normal.  Left Ear: Hearing normal.  Eyes: Conjunctivae and EOM are normal. Pupils are equal, round, and reactive to light.  Scleral icterus noted  Neck: Normal range of motion. Neck supple.  Cardiovascular: Normal rate, regular rhythm, normal heart sounds and  intact distal pulses.   Pulmonary/Chest: Effort normal and breath sounds normal.  Abdominal: Soft. There is no tenderness.  Biliary drain RLQ. No erythema. No signs of obstruction. Draining properly  Musculoskeletal: Normal range of motion.  Neurological: He is alert and oriented to person, place, and time.  Skin: Skin is warm and dry.  Jaundiced skin  Psychiatric: He has a normal mood and affect. His speech is normal and behavior is normal. Thought content normal.  Nursing note and vitals reviewed.  ED Treatments / Results  Labs (all labs ordered are listed, but only abnormal results are displayed) Labs Reviewed  CBC - Abnormal; Notable for the following:       Result Value   RBC 3.11 (*)    Hemoglobin 10.1 (*)    HCT 28.3 (*)    All other components within normal limits  COMPREHENSIVE METABOLIC PANEL - Abnormal; Notable for the following:    Sodium 125 (*)    Chloride 97 (*)    CO2 19 (*)    Glucose, Bld 107 (*)    BUN 50 (*)    Creatinine, Ser 1.86 (*)    Albumin 2.9 (*)    Alkaline Phosphatase 272 (*)    Total Bilirubin 4.0 (*)    GFR calc non Af Amer 39 (*)    GFR calc Af Amer 45 (*)    All other components within normal limits   EKG  EKG Interpretation None       Radiology No results found.  Procedures Procedures (including critical care time)  Medications Ordered in ED Medications  sodium chloride 0.9 % bolus 1,000 mL (not administered)  sodium chloride 0.9 % bolus 1,000 mL (1,000 mLs Intravenous New Bag/Given 09/08/16 1845)   Initial Impression / Assessment and Plan / ED Course  I have reviewed the triage vital signs and the nursing notes.  Pertinent labs & imaging results that were available during my care of the patient were reviewed by me and considered in my medical decision making (see chart for details).  Final Clinical Impressions(s) / ED Diagnoses  {I have reviewed and evaluated the relevant laboratory values.   {I have reviewed the relevant  previous healthcare records.  {I obtained HPI from historian. {Patient discussed with supervising physician.  ED Course:  Assessment: Pt is a 56 y.o. male with hx CBD Obstruction who presents from rehab due to AKI with elevated BUN as well. Hyponatremia and hypokalemia. On exam, pt in NAD. Nontoxic/nonseptic appearing. VSS. Afebrile. Lungs CTA. Heart RRR. Abdomen nontender soft. CBC unremarkable. CMP with mild AKI at 1.86 with previous 2 weeks ago 1.24. BUN 50. Likely pre renal. Hyponatremia noted at 125. Pt scheduled [facility]. Consulted Medicine. Given 2L NS and encourage oral hydration. Discussed with SNF on call physician. Ok to return to facility. Plan is to  DC home back to SNF. At time of discharge, Patient is in no acute distress. Vital Signs are stable. Patient is able to ambulate. Patient able to tolerate PO.   Disposition/Plan:  DC to SNF Additional Verbal discharge instructions given and discussed with patient.  Pt Instructed to f/u with PCP in the next week for evaluation and treatment of symptoms. Return precautions given Pt acknowledges and agrees with plan  Supervising Physician Gwyneth Sprout, MD  Final diagnoses:  Hyponatremia    New Prescriptions New Prescriptions   No medications on file     Audry Pili, PA-C 09/08/16 1919    Gwyneth Sprout, MD 09/11/16 0000

## 2016-09-08 NOTE — Progress Notes (Signed)
Location:   [place] Room Number: 107A Place of Service:  SNF (31)   CODE STATUS:  Full Code  No Known Allergies  Chief Complaint  Patient presents with  . Acute Visit    Renal Failure    HPI:  His labs have returned today showing acute renal failure. I have discussed with this Dr. Eulas Post we both feel as though his needs will be better met in an acute environment. His has hyponatremia of 126; k+ 5.4 with an elevated bun and creat. I have spoken with both him and his family.  '  Past Medical History:  Diagnosis Date  . Anemia 08/16/2016  . Cocaine abuse   . 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 Elevated LFTs   . Hypokalemia   . Jaundice 08/16/2016  . Protein-calorie malnutrition, severe ([place])     Past Surgical History:  Procedure Laterality Date  . ERCP N/A 08/17/2016   Procedure: ENDOSCOPIC RETROGRADE CHOLANGIOPANCREATOGRAPHY (ERCP);  Surgeon: Teena Irani, MD;  Location[facility];  Service: Endoscopy;  Laterality: N/A;  . EYE SURGERY     Right  . IR GENERIC HISTORICAL  08/18/2016   IR INT EXT BILIARY DRAIN WITH CHOLANGIOGRAM 08/18/2016 Corrie Mckusick, DO [facility]  . IR GENERIC HISTORICAL  08/29/2016   IR EXCHANGE BILIARY DRAIN 08/29/2016 Marybelle Killings, MD [facility]    Social History   Social History  . Marital status: Single    Spouse name: N/A  . Number of children: N/A  . Years of education: N/A   Occupational History  . Not on file.   Social History Main Topics  . Smoking status: Former Research scientist (life sciences)  . Smokeless tobacco: Never Used  . Alcohol use No     Comment: Former drinker  . Drug use: Yes    Types: Cocaine  . Sexual activity: Not on file   Other Topics Concern  . Not on file   Social History Narrative  . No narrative on file   Family History  Problem Relation Age of Onset  . Family history unknown: Yes      VITAL SIGNS BP 111/73   Pulse 78   Temp 98.2 F (36.8 C)   Resp 16   Ht 5' 8"  (1.727 m)   Wt 133 lb 12.8  oz (60.7 kg)   SpO2 94%   BMI 20.34 kg/m   Patient's Medications  New Prescriptions   No medications on file  Previous Medications   ACETAMINOPHEN (TYLENOL) 325 MG TABLET    Take by mouth every 6 (six) hours as needed.   DOCUSATE SODIUM (COLACE) 100 MG CAPSULE    Take 1 capsule (100 mg total) by mouth 2 (two) times daily. Hold for diarrhea   MULTIPLE VITAMINS-MINERALS (EMERGEN-C VITAMIN C PO)    Take 1 packet by mouth daily.   POLYETHYLENE GLYCOL (MIRALAX / GLYCOLAX) PACKET    Take 17 g by mouth daily.   SODIUM CHLORIDE 0.9 % INJECTION    Flush Bilary tube with 10 cc of normal saline every shift  Modified Medications   No medications on file  Discontinued Medications   ACETAMINOPHEN (TYLENOL) 325 MG TABLET    Take 2 tablets (650 mg total) by mouth every 6 (six) hours as needed for mild pain or headache (or Fever >/= 101).     SIGNIFICANT DIAGNOSTIC EXAMS  08-15-16: chest x-ray: No active cardiopulmonary disease.  08-15-16: abdominal ultrasound: Gallbladder is distended and diffusely sludge filled with positive Murphy's sign.  No stones are identified but changes could indicate cholecystitis. Intra and extrahepatic bile duct dilatation is present. No cause is identified but the distal common bile duct is not visualized.  08-15-16: ct of abdomen and pelvis: Severe intra and extrahepatic biliary ductal dilatation and gallbladder enlargement with sharp tapering to occlusion of the common bile duct within the pancreatic head. No obstructing radiopaque gallstone identified. Possible mass near the sphincter of Oddi. No pancreatic ductal dilatation. MRI/MRCP of the abdomen with and without contrast is recommended.  08-16-16: MRI abdomen with mrcp: 1. Severe intrahepatic and extrahepatic biliary duct dilatation to the level of the pancreatic head with abrupt narrowing of the common bile duct approximately 10 mm from the ampulla. No obstructing lesion identified in the pancreatic head. No pancreatic duct  dilatation. No choledocholithiasis or cholelithiasis. Presumably occult obstructing lesion in the pancreatic head versus ampulla. Recommend ERCP for relief of obstruction and potential tissue sampling. 2. No pancreatic duct dilatation or inflammation. Potential pancreas divisum variant ductal anatomy. 3. Distended gallbladder.  No evidence cholelithiasis.  08-17-16: ERCP:  The major papilla was bulging. The ventral pancreatic duct was deeply cannulated. Ductal flow or contrast was adequate. Contrast extended to the distal pancreatic duct. Opacification of the main pancreatic duct was done. The maximum of the duct was 2 mm. The entire opacified area was normal. The bile duct could not be cannulated.   08-17-16: kub Failed ERCP.  08-18-16: IR biliary drain with cholangiogram: Status post percutaneous transhepatic cholangiogram with placement of internal/external biliary drain across ampullary obstruction. Sample of bile sent the to lab for analysis.   LABS REVIEWED:   08-15-16: wbc 8.3; hgb 8.8 hct 97.4; mcv 97.4; plt 373; glucose 98; bun 24; creat 1.36; k+ 3.6; na++ 126; ast 407; alt 330; alk phos 1140; total bili 24.9; albumin 2.2; acute hepatitis panel: negative; urine drug screen: + cocaine 08-16-16; vit B 12; folate 19.1; iron 48; ferritin 1656; HIV: nr 08-17-16: wbc 6.1; hgb 8.3; hct 25.1; mcv 97.7; plt 381; glucose 99; bun 15; creat 0.95; k+ 3.8; na++ 129; ast 243; alt 258; alk phos 1048; total bili 9.0; albumin 1.8 CA 19-1: 1171 08-19-16: wbc 9.5; hgb 9.0; hct 27.1; mcv 98.9; plt 397  08-21-16: glucose 86; bun 12; creat 1.08; k+ 3.0; na++ 133; ast 80; alt 131; alk phos 695; total bili: 9.0; albumin 1.8; mag 1.6 08-22-16: glucose 108; bun 15; creat 1.24; k+ 3.2 ;na++ 132; ast 69; alt 105; alk phos 561; total bili: 8.0; albumin 1.8  09-08-16: glucose 88; bun 53.2; creat 1.58; k+ 5.4; na++ 126; ast 42; alt 53; alk phos 350; total bili 4.9; albumin 3.8   Review of Systems  Constitutional: Negative for  malaise/fatigue.  Respiratory: Negative for cough and shortness of breath.   Cardiovascular: Negative for chest pain, palpitations and leg swelling.  Gastrointestinal: Negative for abdominal pain, constipation and heartburn.  Musculoskeletal: Negative for back pain, joint pain and myalgias.  Skin: Negative.        Has biliary drain   Neurological: Negative for dizziness.  Psychiatric/Behavioral: The patient is not nervous/anxious.      Physical Exam  Constitutional: He is oriented to person, place, and time. No distress.  Thin   Eyes: Conjunctivae are normal.  Sclera icteric   Neck: Neck supple. No JVD present. No thyromegaly present.  Cardiovascular: Normal rate, regular rhythm and intact distal pulses.   Respiratory: Effort normal and breath sounds normal. No respiratory distress. He has no wheezes.  GI: Soft. Bowel  sounds are normal. He exhibits no distension. There is no tenderness.  Has biliary drain with large amount yellow fluid return site without signs of infection present.   Musculoskeletal: He exhibits no edema.  Able to move all extremities   Lymphadenopathy:    He has no cervical adenopathy.  Neurological: He is alert and oriented to person, place, and time.  Skin: Skin is warm and dry. He is not diaphoretic.  Psychiatric: He has a normal mood and affect.      ASSESSMENT/ PLAN:  1. Acute renal failure with hyperkalemia and hyponatremia:   After discussion with Dr. Eulas Post will send to the ED for further evaluation and treatment options.      Time spent with patient  40   minutes >50% time spent counseling; reviewing medical record; tests; labs; and developing future plan of care   MD is aware of resident's narcotic use and is in agreement with current plan of care. We will attempt to wean resident as apropriate     Ok Edwards NP [facility]  Contact [phone] Monday through Friday 8am- 5pm  After hours call [phone]

## 2016-09-11 ENCOUNTER — Encounter: Payer: Self-pay | Admitting: Adult Health

## 2016-09-11 ENCOUNTER — Non-Acute Institutional Stay (SKILLED_NURSING_FACILITY): Payer: Medicare Other | Admitting: Adult Health

## 2016-09-11 DIAGNOSIS — N179 Acute kidney failure, unspecified: Secondary | ICD-10-CM

## 2016-09-11 NOTE — Progress Notes (Signed)
Location:   [place] Room Number: 107A Place of Service:  SNF (31)   CODE STATUS:  Full Code  No Known Allergies  Chief Complaint  Patient presents with  . Acute Visit    ER follow up    HPI:  He was sent to the ED due to his acute renal failure. He was given IVF and returned. He tells me that he is feeling better and has no complaints. He tells me that he is drinking adequate fluids.    Past Medical History:  Diagnosis Date  . Anemia 08/16/2016  . Cocaine abuse   . 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 Elevated LFTs   . Hypokalemia   . Jaundice 08/16/2016  . Protein-calorie malnutrition, severe ([place])     Past Surgical History:  Procedure Laterality Date  . ERCP N/A 08/17/2016   Procedure: ENDOSCOPIC RETROGRADE CHOLANGIOPANCREATOGRAPHY (ERCP);  Surgeon: Teena Irani, MD;  Location: [facility];  Service: Endoscopy;  Laterality: N/A;  . EYE SURGERY     Right  . IR GENERIC HISTORICAL  08/18/2016   IR INT EXT BILIARY DRAIN WITH CHOLANGIOGRAM 08/18/2016 Corrie Mckusick, DO [facility]INTER[facility]  . IR GENERIC HISTORICAL  08/29/2016   IR EXCHANGE BILIARY DRAIN 08/29/2016 Marybelle Killings, MD [facility]    Social History   Social History  . Marital status: Single    Spouse name: N/A  . Number of children: N/A  . Years of education: N/A   Occupational History  . Not on file.   Social History Main Topics  . Smoking status: Former Research scientist (life sciences)  . Smokeless tobacco: Never Used  . Alcohol use No     Comment: Former drinker  . Drug use: Yes    Types: Cocaine  . Sexual activity: Not on file   Other Topics Concern  . Not on file   Social History Narrative  . No narrative on file   Family History  Problem Relation Age of Onset  . Family history unknown: Yes      VITAL SIGNS BP 111/73   Pulse 78   Temp 98.2 F (36.8 C)   Resp 16   Ht _0  (1.727 m)   Wt 133 lb 12.8 oz (60.7 kg)   SpO2 94% Comment: room air  BMI 20.34 kg/m   Patient's Medications    New Prescriptions   No medications on file  Previous Medications   ACETAMINOPHEN (TYLENOL) 325 MG TABLET    Take by mouth every 6 (six) hours as needed.   AMINO ACIDS-PROTEIN HYDROLYS (FEEDING SUPPLEMENT, PRO-STAT SUGAR FREE 64,) LIQD    Take 30 mLs by mouth 2 (two) times daily.   DOCUSATE SODIUM (COLACE) 100 MG CAPSULE    Take 1 capsule (100 mg total) by mouth 2 (two) times daily. Hold for diarrhea   MULTIPLE VITAMINS-MINERALS (EMERGEN-C VITAMIN C PO)    Take 1 packet by mouth daily.   POLYETHYLENE GLYCOL (MIRALAX / GLYCOLAX) PACKET    Take 17 g by mouth daily.   SODIUM CHLORIDE 0.9 % INJECTION    Flush Bilary tube with 10 cc of normal saline every shift   UNABLE TO FIND    House 2.0 - Give 120 cc by mouth 3 times daily between meals  Modified Medications   No medications on file  Discontinued Medications   No medications on file     SIGNIFICANT DIAGNOSTIC EXAMS  08-15-16: chest x-ray: No active cardiopulmonary disease.  08-15-16: abdominal ultrasound: Gallbladder is distended  and diffusely sludge filled with positive Murphy's sign. No stones are identified but changes could indicate cholecystitis. Intra and extrahepatic bile duct dilatation is present. No cause is identified but the distal common bile duct is not visualized.  08-15-16: ct of abdomen and pelvis: Severe intra and extrahepatic biliary ductal dilatation and gallbladder enlargement with sharp tapering to occlusion of the common bile duct within the pancreatic head. No obstructing radiopaque gallstone identified. Possible mass near the sphincter of Oddi. No pancreatic ductal dilatation. MRI/MRCP of the abdomen with and without contrast is recommended.  08-16-16: MRI abdomen with mrcp: 1. Severe intrahepatic and extrahepatic biliary duct dilatation to the level of the pancreatic head with abrupt narrowing of the common bile duct approximately 10 mm from the ampulla. No obstructing lesion identified in the pancreatic head. No pancreatic  duct dilatation. No choledocholithiasis or cholelithiasis. Presumably occult obstructing lesion in the pancreatic head versus ampulla. Recommend ERCP for relief of obstruction and potential tissue sampling. 2. No pancreatic duct dilatation or inflammation. Potential pancreas divisum variant ductal anatomy. 3. Distended gallbladder.  No evidence cholelithiasis.  08-17-16: ERCP:  The major papilla was bulging. The ventral pancreatic duct was deeply cannulated. Ductal flow or contrast was adequate. Contrast extended to the distal pancreatic duct. Opacification of the main pancreatic duct was done. The maximum of the duct was 2 mm. The entire opacified area was normal. The bile duct could not be cannulated.   08-17-16: kub Failed ERCP.  08-18-16: IR biliary drain with cholangiogram: Status post percutaneous transhepatic cholangiogram with placement of internal/external biliary drain across ampullary obstruction. Sample of bile sent the to lab for analysis.   LABS REVIEWED:   08-15-16: wbc 8.3; hgb 8.8 hct 97.4; mcv 97.4; plt 373; glucose 98; bun 24; creat 1.36; k+ 3.6; na++ 126; ast 407; alt 330; alk phos 1140; total bili 24.9; albumin 2.2; acute hepatitis panel: negative; urine drug screen: + cocaine 08-16-16; vit B 12; folate 19.1; iron 48; ferritin 1656; HIV: nr 08-17-16: wbc 6.1; hgb 8.3; hct 25.1; mcv 97.7; plt 381; glucose 99; bun 15; creat 0.95; k+ 3.8; na++ 129; ast 243; alt 258; alk phos 1048; total bili 9.0; albumin 1.8 CA 19-1: 1171 08-19-16: wbc 9.5; hgb 9.0; hct 27.1; mcv 98.9; plt 397  08-21-16: glucose 86; bun 12; creat 1.08; k+ 3.0; na++ 133; ast 80; alt 131; alk phos 695; total bili: 9.0; albumin 1.8; mag 1.6 08-22-16: glucose 108; bun 15; creat 1.24; k+ 3.2 ;na++ 132; ast 69; alt 105; alk phos 561; total bili: 8.0; albumin 1.8  09-08-16: glucose 88; bun 53.2; creat 1.58; k+ 5.4; na++ 126; ast 42; alt 53; alk phos 350; total bili 4.9; albumin 3.8  09-08-16: (ED); wbc 7.8; hgb 10.1; hct 2.83; mcv 91.0;  plt 342; glucose 108; bun 50; creat 1.86; k+ 4.5; na++ 125; alk phos 272; total bili 4.0; albumin 2.9    Review of Systems  Constitutional: Negative for malaise/fatigue.  Respiratory: Negative for cough and shortness of breath.   Cardiovascular: Negative for chest pain, palpitations and leg swelling.  Gastrointestinal: Negative for abdominal pain, constipation and heartburn.  Musculoskeletal: Negative for back pain, joint pain and myalgias.  Skin: Negative.        Has biliary drain   Neurological: Negative for dizziness.  Psychiatric/Behavioral: The patient is not nervous/anxious.      Physical Exam  Constitutional: He is oriented to person, place, and time. No distress.  Thin   Eyes: Conjunctivae are normal.  Sclera icteric  Neck: Neck supple. No JVD present. No thyromegaly present.  Cardiovascular: Normal rate, regular rhythm and intact distal pulses.   Respiratory: Effort normal and breath sounds normal. No respiratory distress. He has no wheezes.  GI: Soft. Bowel sounds are normal. He exhibits no distension. There is no tenderness.  Has biliary drain with large amount yellow fluid return site without signs of infection present.   Musculoskeletal: He exhibits no edema.  Able to move all extremities   Lymphadenopathy:    He has no cervical adenopathy.  Neurological: He is alert and oriented to person, place, and time.  Skin: Skin is warm and dry. He is not diaphoretic.  Psychiatric: He has a normal mood and affect.      ASSESSMENT/ PLAN:  1. Acute renal failure: has resolved; will not make changes will monitor   MD is aware of resident's narcotic use and is in agreement with current plan of care. We will attempt to wean resident as apropriate     Ok Edwards NP [facility]  Contact [phone] Monday through Friday 8am- 5pm  After hours call [phone]

## 2016-09-12 LAB — BASIC METABOLIC PANEL
BUN: 23 mg/dL — AB (ref 4–21)
CREATININE: 1.2 mg/dL (ref 0.6–1.3)
Glucose: 104 mg/dL
POTASSIUM: 4 mmol/L (ref 3.4–5.3)
Sodium: 132 mmol/L — AB (ref 137–147)

## 2016-09-14 ENCOUNTER — Non-Acute Institutional Stay (SKILLED_NURSING_FACILITY): Payer: Medicare Other | Admitting: Adult Health

## 2016-09-14 ENCOUNTER — Encounter: Payer: Self-pay | Admitting: Adult Health

## 2016-09-14 DIAGNOSIS — R17 Unspecified jaundice: Secondary | ICD-10-CM | POA: Diagnosis not present

## 2016-09-14 DIAGNOSIS — D638 Anemia in other chronic diseases classified elsewhere: Secondary | ICD-10-CM | POA: Diagnosis not present

## 2016-09-14 DIAGNOSIS — R978 Other abnormal tumor markers: Secondary | ICD-10-CM | POA: Diagnosis not present

## 2016-09-14 DIAGNOSIS — K831 Obstruction of bile duct: Secondary | ICD-10-CM | POA: Diagnosis not present

## 2016-09-14 NOTE — Progress Notes (Signed)
Location:   [facility] Room Number: 107A Place of Service:  SNF (31)    CODE STATUS: Full Code  No Known Allergies  Chief Complaint  Patient presents with  . Discharge Note    [facility] from [employer]    HPI:  He is being discharged to home with home health for rn. He will not need any dme. He will need his prescriptions to be written and will need to follow up with his medical provider.  He is due to get his biliary drain removed prior to his discharge.   Past Medical History:  Diagnosis Date  . Anemia 08/16/2016  . Cocaine abuse   . 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 Elevated LFTs   . Hypokalemia   . Jaundice 08/16/2016  . Protein-calorie malnutrition, severe ([place])     Past Surgical History:  Procedure Laterality Date  . ERCP N/A 08/17/2016   Procedure: ENDOSCOPIC RETROGRADE CHOLANGIOPANCREATOGRAPHY (ERCP);  Surgeon: Teena Irani, MD;  Location: [facility];  Service: Endoscopy;  Laterality: N/A;  . EYE SURGERY     Right  . IR GENERIC HISTORICAL  08/18/2016   IR INT EXT BILIARY DRAIN WITH CHOLANGIOGRAM 08/18/2016 Corrie Mckusick, DO [facility]  . IR GENERIC HISTORICAL  08/29/2016   IR EXCHANGE BILIARY DRAIN 08/29/2016 Marybelle Killings, MD [facility]    Social History   Social History  . Marital status: Single    Spouse name: N/A  . Number of children: N/A  . Years of education: N/A   Occupational History  . Not on file.   Social History Main Topics  . Smoking status: Former Research scientist (life sciences)  . Smokeless tobacco: Never Used  . Alcohol use No     Comment: Former drinker  . Drug use: Yes    Types: Cocaine  . Sexual activity: Not on file   Other Topics Concern  . Not on file   Social History Narrative  . No narrative on file   Family History  Problem Relation Age of Onset  . Family history unknown: Yes    VITAL SIGNS BP 103/73   Pulse 86   Temp 97.4 F (36.3 C)   Resp 16   Ht _0  (1.727 m)   Wt 133 lb (60.3 kg)   SpO2 94%   BMI  20.22 kg/m   Patient's Medications  New Prescriptions   No medications on file  Previous Medications   ACETAMINOPHEN (TYLENOL) 325 MG TABLET    Take 650 mg by mouth every 6 (six) hours as needed.    AMINO ACIDS-PROTEIN HYDROLYS (FEEDING SUPPLEMENT, PRO-STAT SUGAR FREE 64,) LIQD    Take 30 mLs by mouth 2 (two) times daily.   DOCUSATE SODIUM (COLACE) 100 MG CAPSULE    Take 1 capsule (100 mg total) by mouth 2 (two) times daily. Hold for diarrhea   MULTIPLE VITAMIN (MULTIVITAMIN) TABLET    Take 1 tablet by mouth daily.   POLYETHYLENE GLYCOL (MIRALAX / GLYCOLAX) PACKET    Take 17 g by mouth daily.   SODIUM CHLORIDE 0.9 % INJECTION    Flush Bilary tube with 10 cc of normal saline every shift   UNABLE TO FIND    House 2.0 - Give 120 cc by mouth 3 times daily between meals  Modified Medications   No medications on file  Discontinued Medications   MULTIPLE VITAMINS-MINERALS (EMERGEN-C VITAMIN C PO)    Take 1 packet by mouth daily.     SIGNIFICANT DIAGNOSTIC EXAMS  08-15-16: chest x-ray: No active cardiopulmonary disease.  08-15-16: abdominal ultrasound: Gallbladder is distended and diffusely sludge filled with positive Murphy's sign. No stones are identified but changes could indicate cholecystitis. Intra and extrahepatic bile duct dilatation is present. No cause is identified but the distal common bile duct is not visualized.  08-15-16: ct of abdomen and pelvis: Severe intra and extrahepatic biliary ductal dilatation and gallbladder enlargement with sharp tapering to occlusion of the common bile duct within the pancreatic head. No obstructing radiopaque gallstone identified. Possible mass near the sphincter of Oddi. No pancreatic ductal dilatation. MRI/MRCP of the abdomen with and without contrast is recommended.  08-16-16: MRI abdomen with mrcp: 1. Severe intrahepatic and extrahepatic biliary duct dilatation to the level of the pancreatic head with abrupt narrowing of the common bile duct approximately  10 mm from the ampulla. No obstructing lesion identified in the pancreatic head. No pancreatic duct dilatation. No choledocholithiasis or cholelithiasis. Presumably occult obstructing lesion in the pancreatic head versus ampulla. Recommend ERCP for relief of obstruction and potential tissue sampling. 2. No pancreatic duct dilatation or inflammation. Potential pancreas divisum variant ductal anatomy. 3. Distended gallbladder.  No evidence cholelithiasis.  08-17-16: ERCP:  The major papilla was bulging. The ventral pancreatic duct was deeply cannulated. Ductal flow or contrast was adequate. Contrast extended to the distal pancreatic duct. Opacification of the main pancreatic duct was done. The maximum of the duct was 2 mm. The entire opacified area was normal. The bile duct could not be cannulated.   08-17-16: kub Failed ERCP.  08-18-16: IR biliary drain with cholangiogram: Status post percutaneous transhepatic cholangiogram with placement of internal/external biliary drain across ampullary obstruction. Sample of bile sent the to lab for analysis.   LABS REVIEWED:   08-15-16: wbc 8.3; hgb 8.8 hct 97.4; mcv 97.4; plt 373; glucose 98; bun 24; creat 1.36; k+ 3.6; na++ 126; ast 407; alt 330; alk phos 1140; total bili 24.9; albumin 2.2; acute hepatitis panel: negative; urine drug screen: + cocaine 08-16-16; vit B 12; folate 19.1; iron 48; ferritin 1656; HIV: nr 08-17-16: wbc 6.1; hgb 8.3; hct 25.1; mcv 97.7; plt 381; glucose 99; bun 15; creat 0.95; k+ 3.8; na++ 129; ast 243; alt 258; alk phos 1048; total bili 9.0; albumin 1.8 CA 19-1: 1171 08-19-16: wbc 9.5; hgb 9.0; hct 27.1; mcv 98.9; plt 397  08-21-16: glucose 86; bun 12; creat 1.08; k+ 3.0; na++ 133; ast 80; alt 131; alk phos 695; total bili: 9.0; albumin 1.8; mag 1.6 08-22-16: glucose 108; bun 15; creat 1.24; k+ 3.2 ;na++ 132; ast 69; alt 105; alk phos 561; total bili: 8.0; albumin 1.8  09-08-16: glucose 88; bun 53.2; creat 1.58; k+ 5.4; na++ 126; ast 42; alt 53;  alk phos 350; total bili 4.9; albumin 3.8      Review of Systems  Constitutional: Negative for malaise/fatigue.  Respiratory: Negative for cough and shortness of breath.   Cardiovascular: Negative for chest pain, palpitations and leg swelling.  Gastrointestinal: Negative for abdominal pain, constipation and heartburn.  Musculoskeletal: Negative for back pain, joint pain and myalgias.  Skin: Negative.        Has biliary drain   Neurological: Negative for dizziness.  Psychiatric/Behavioral: The patient is not nervous/anxious.      Physical Exam  Constitutional: He is oriented to person, place, and time. No distress.  Thin   Eyes: Conjunctivae are normal.  Sclera icteric   Neck: Neck supple. No JVD present. No thyromegaly present.  Cardiovascular: Normal rate, regular  rhythm and intact distal pulses.   Respiratory: Effort normal and breath sounds normal. No respiratory distress. He has no wheezes.  GI: Soft. Bowel sounds are normal. He exhibits no distension. There is no tenderness.  Has biliary drain with large amount yellow fluid return site without signs of infection present.   Musculoskeletal: He exhibits no edema.  Able to move all extremities   Lymphadenopathy:    He has no cervical adenopathy.  Neurological: He is alert and oriented to person, place, and time.  Skin: Skin is warm and dry. He is not diaphoretic.  Psychiatric: He has a normal mood and affect.      ASSESSMENT/ PLAN:  Patient is being discharged with the following home health services:  rn: to evaluate and treat as indicated for medication management.  Patient is being discharged with the following durable medical equipment:  Non required   Patient has been advised to f/u with their PCP in 1-2 weeks to bring them up to date on their rehab stay.  Social services at facility was responsible for arranging this appointment.  Pt was provided with a 30 day supply of prescriptions for medications and refills must  be obtained from their PCP.  For controlled substances, a more limited supply may be provided adequate until PCP appointment only.   Time spent with patient 45   minutes >50% time spent counseling; reviewing medical record; tests; labs; and developing future plan of care   Ok Edwards NP [facility]  Contact [phone] Monday through Friday 8am- 5pm  After hours call [phone]

## 2017-06-09 DEATH — deceased

## 2018-12-18 IMAGING — US US ABDOMEN COMPLETE
1 series · 13 of 25 positions shown · non-contrast
Comparison: None.

CLINICAL DATA: Abnormal liver function studies.  Jaundice.

EXAM:
ABDOMEN ULTRASOUND COMPLETE

[Series 1: us abdomen complete · 0.25mm/px · 13 of 93 slices shown]
[im 1/93]
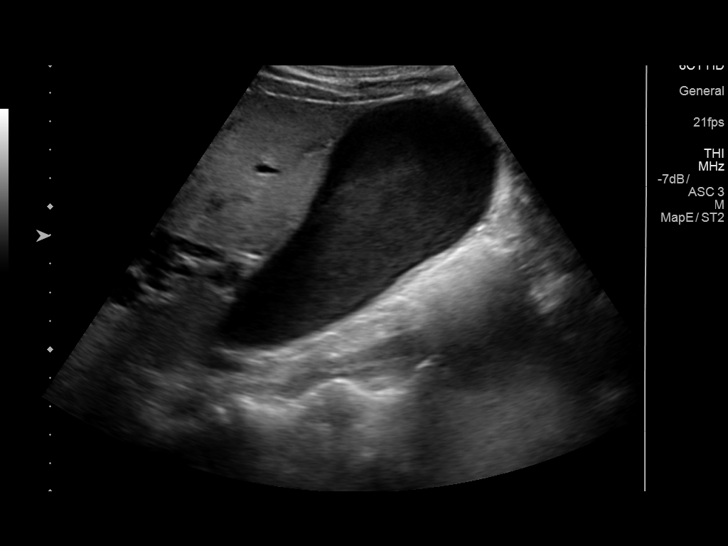
[im 8/93]
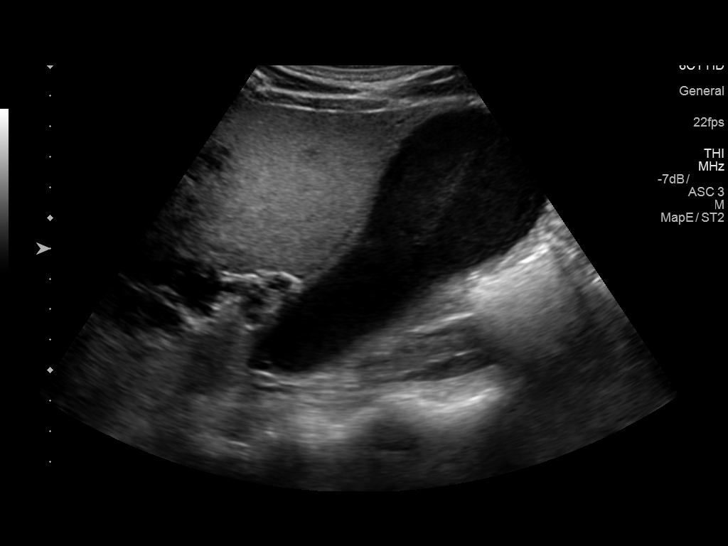
[im 16/93]
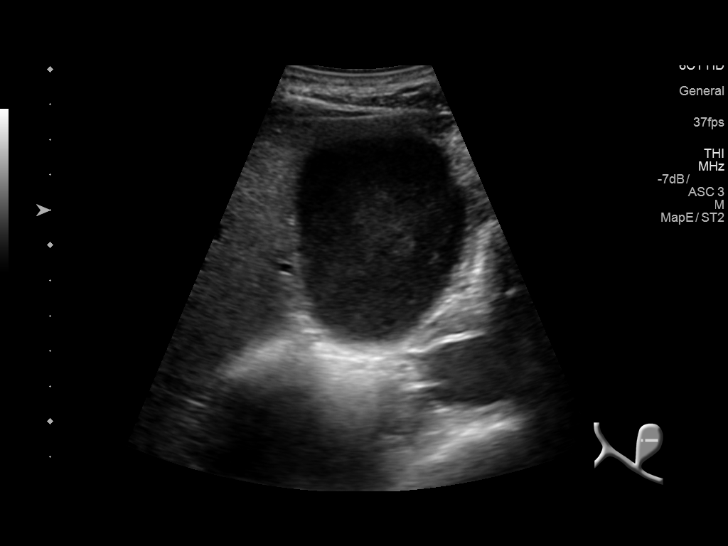
[im 24/93]
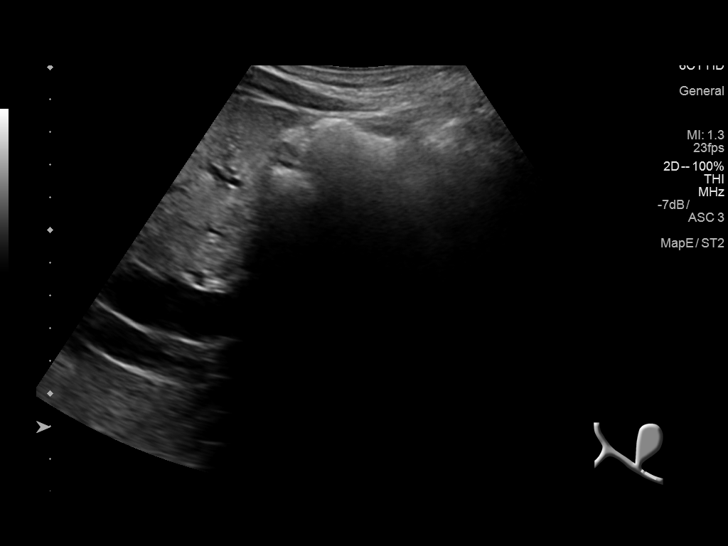
[im 31/93]
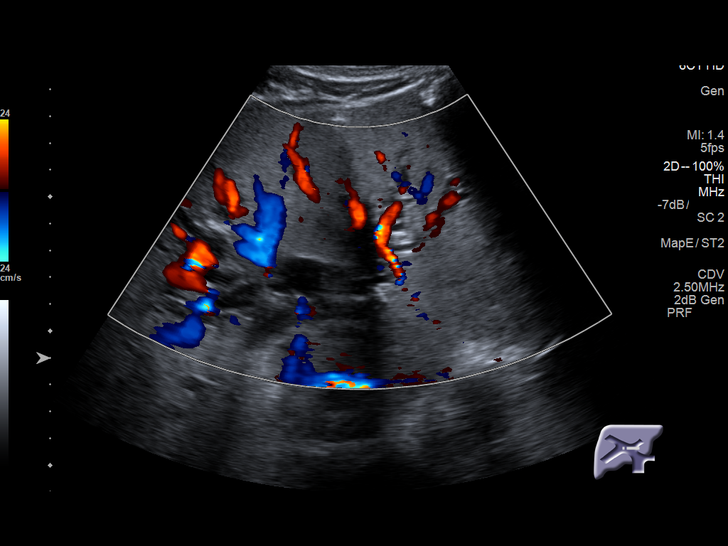
[im 39/93]
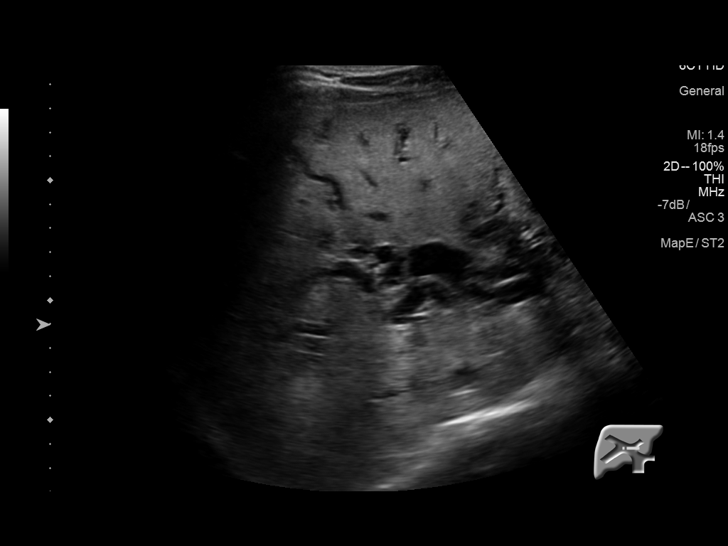
[im 47/93]
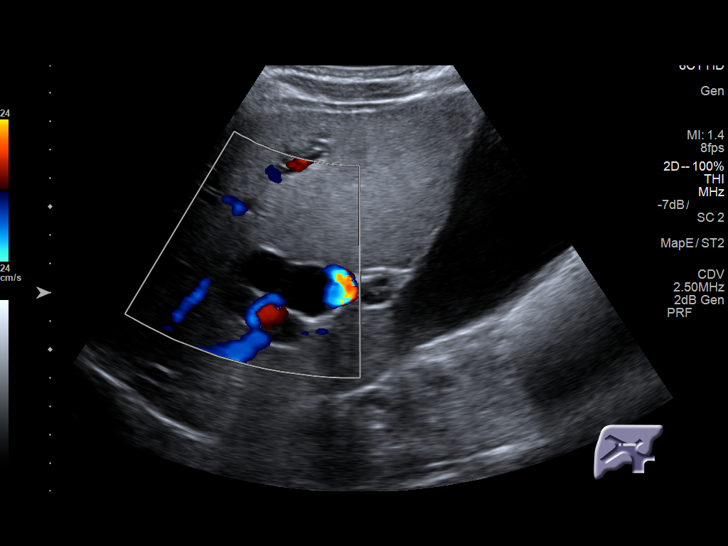
[im 54/93]
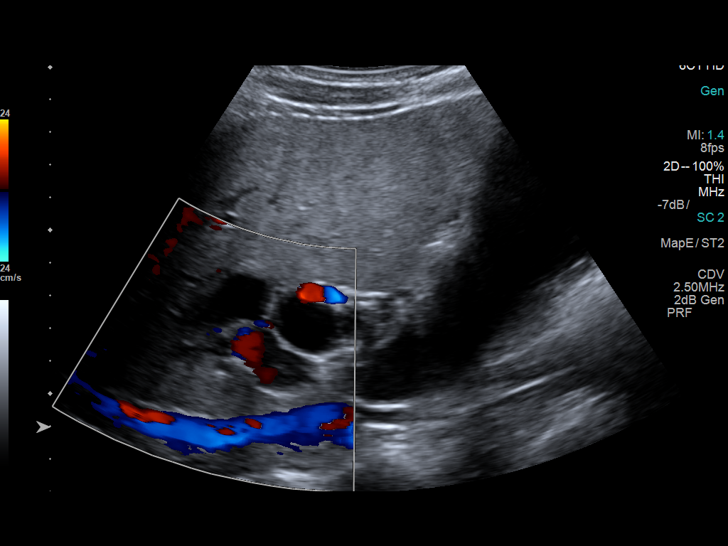
[im 62/93]
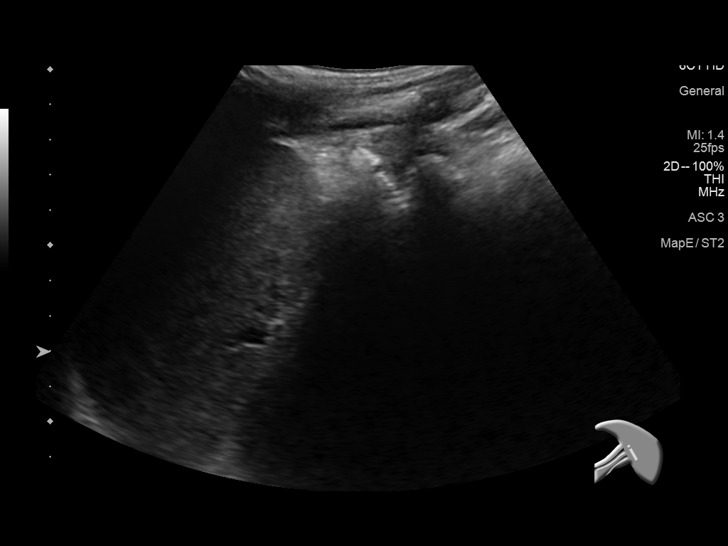
[im 70/93]
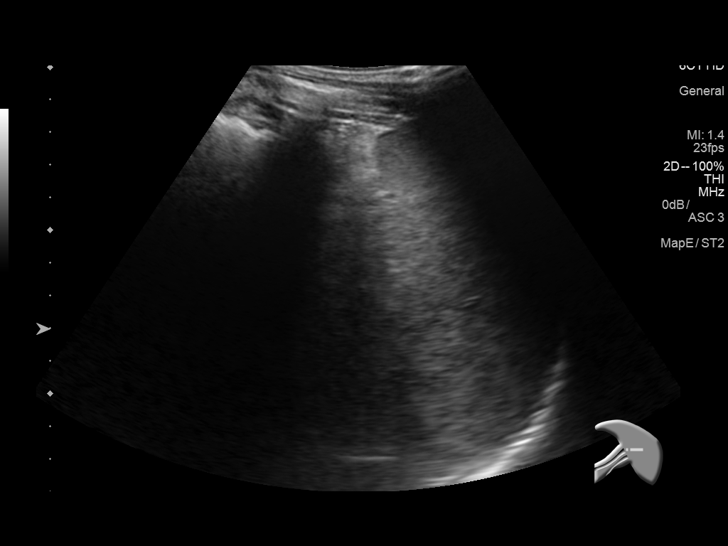
[im 77/93]
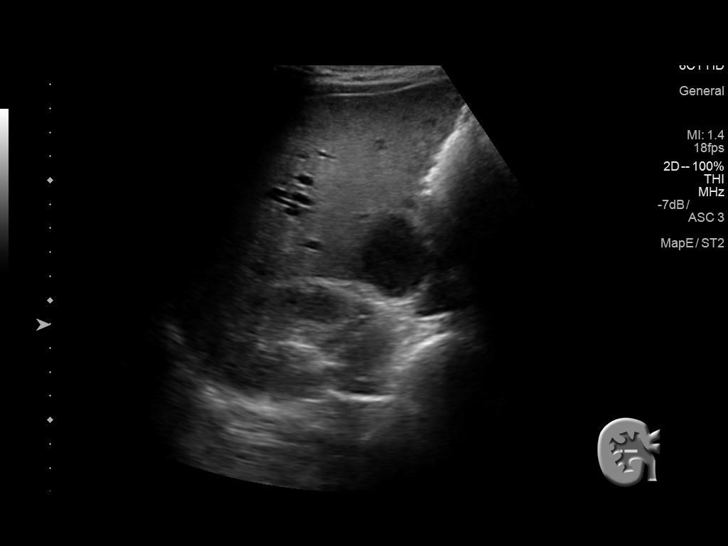
[im 85/93]
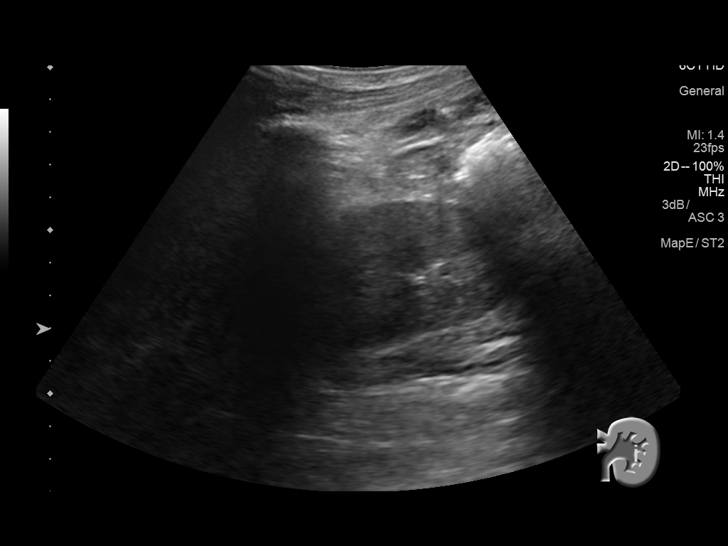
[im 93/93]
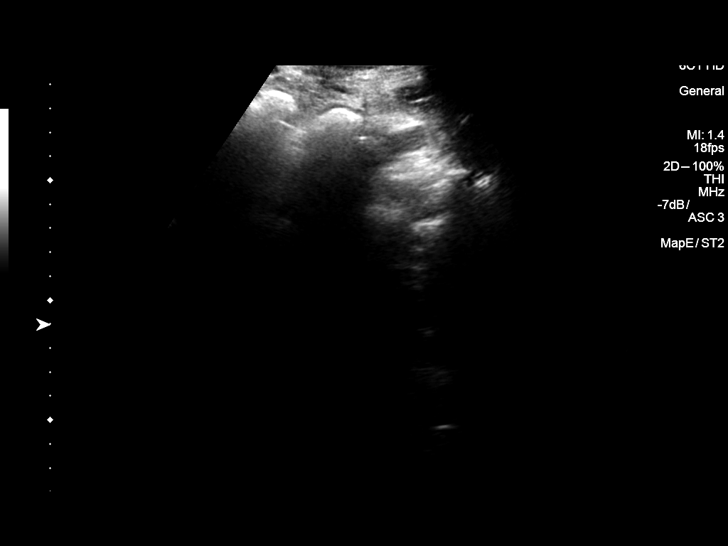

[13 of 25 positions shown; findings below may reference images not displayed]

FINDINGS: Gallbladder: Gallbladder is mildly distended and is diffusely sludge
filled. No focal stones identified. No gallbladder wall thickening
or edema. Murphy's sign is positive.

Common bile duct: Diameter: 16 mm. Intra and extrahepatic bile ducts
are dilated. No stone or obstructing lesion is identified but the
distal common bile duct is not visualized due to bowel gas.

Liver: Diffusely increased hepatic parenchymal echotexture
suggesting fatty infiltration. No focal lesions identified.

IVC: No abnormality visualized.

Pancreas: Visualized portion unremarkable.

Spleen: Size and appearance within normal limits.

Right Kidney: Length: 12.7 cm. Echogenicity within normal limits. No
mass or hydronephrosis visualized.

Left Kidney: Length: 11.5 cm. Echogenicity within normal limits. No
mass or hydronephrosis visualized.

Abdominal aorta: No aneurysm visualized.

Other findings: None.
IMPRESSION: Gallbladder is distended and diffusely sludge filled with positive
Murphy's sign. No stones are identified but changes could indicate
cholecystitis. Intra and extrahepatic bile duct dilatation is
present. No cause is identified but the distal common bile duct is
not visualized.
